# Patient Record
Sex: Female | Born: 1980
Health system: Southern US, Community
[De-identification: ages and names within clinical notes are randomized; demographics above are authoritative.]

## PROBLEM LIST (undated history)

## (undated) DIAGNOSIS — Z9889 Other specified postprocedural states: Secondary | ICD-10-CM

## (undated) DIAGNOSIS — T8859XA Other complications of anesthesia, initial encounter: Secondary | ICD-10-CM

## (undated) DIAGNOSIS — M1990 Unspecified osteoarthritis, unspecified site: Secondary | ICD-10-CM

## (undated) DIAGNOSIS — F419 Anxiety disorder, unspecified: Secondary | ICD-10-CM

## (undated) DIAGNOSIS — T7840XA Allergy, unspecified, initial encounter: Secondary | ICD-10-CM

## (undated) DIAGNOSIS — R112 Nausea with vomiting, unspecified: Secondary | ICD-10-CM

## (undated) DIAGNOSIS — K5090 Crohn's disease, unspecified, without complications: Secondary | ICD-10-CM

## (undated) DIAGNOSIS — K219 Gastro-esophageal reflux disease without esophagitis: Secondary | ICD-10-CM

## (undated) HISTORY — DX: Anxiety disorder, unspecified: F41.9

## (undated) HISTORY — PX: KNEE ARTHROSCOPY: SUR90

## (undated) HISTORY — DX: Gastro-esophageal reflux disease without esophagitis: K21.9

## (undated) HISTORY — DX: Unspecified osteoarthritis, unspecified site: M19.90

## (undated) HISTORY — DX: Allergy, unspecified, initial encounter: T78.40XA

---

## 2000-08-13 HISTORY — PX: LIPOMA EXCISION: SHX5283

## 2006-08-13 HISTORY — PX: TONSILLECTOMY: SUR1361

## 2016-03-16 DIAGNOSIS — N83209 Unspecified ovarian cyst, unspecified side: Secondary | ICD-10-CM | POA: Diagnosis not present

## 2016-03-16 DIAGNOSIS — R1031 Right lower quadrant pain: Secondary | ICD-10-CM | POA: Diagnosis not present

## 2016-04-14 ENCOUNTER — Encounter: Payer: Self-pay | Admitting: *Deleted

## 2016-04-14 ENCOUNTER — Ambulatory Visit
Admission: EM | Admit: 2016-04-14 | Discharge: 2016-04-14 | Disposition: A | Discharge status: Home / Self Care | Payer: 59 | Attending: Family Medicine | Admitting: Family Medicine

## 2016-04-14 DIAGNOSIS — G43909 Migraine, unspecified, not intractable, without status migrainosus: Secondary | ICD-10-CM

## 2016-04-14 HISTORY — DX: Crohn's disease, unspecified, without complications: K50.90

## 2016-04-14 MED ORDER — KETOROLAC TROMETHAMINE 60 MG/2ML IM SOLN
60.0000 mg | Freq: Once | INTRAMUSCULAR | Status: AC
  Administered 2016-04-14: 60 mg via INTRAMUSCULAR

## 2016-04-14 MED ORDER — PROMETHAZINE HCL 25 MG/ML IJ SOLN
25.0000 mg | Freq: Four times a day (QID) | INTRAMUSCULAR | Status: DC | PRN
  Administered 2016-04-14: 25 mg via INTRAMUSCULAR

## 2016-04-14 MED ORDER — ONDANSETRON 8 MG PO TBDP
8.0000 mg | ORAL_TABLET | Freq: Once | ORAL | Status: AC
  Administered 2016-04-14: 8 mg via ORAL

## 2016-04-14 NOTE — ED Provider Notes (Signed)
[facility] ____________________________________________  Time seen: Approximately 9:00 AM  I have reviewed the triage vital signs and the nursing notes.   HISTORY  Chief Complaint Migraine; Nausea; and Emesis  HPI Erin Hull is a 35 y.o. female  presents with mother at bedside for the complaints of migraine headache. Patient reports she has a chronic history of migraine headaches which are normally triggered by stress or hormonal changes. Patient reports onset of headache yesterday evening that came on gradually, and gradually worsened. Patient reports her headache is a left-sided throbbing headache, with accompanying nausea, sound sensitivity and a mild light sensitivity. Patient reports the symptoms are consistent with her normal past migraines. Denies abrupt onset. Denies worst headache of her life. Reports vomiting 1 this morning. Reports has continued drink fluids well.  Denies recent sickness. Denies fall, injury or head trauma. Patient reports that she has taken over-the-counter ibuprofen, but also reports she did take a tramadol last night which did help with the pain. Patient reports last migraine headache was approximately 5 months ago. Patient reports she used to have migraines multiple times per month related to stress, but reports she removed her stressful situation.  Patient reports she started a new oral contraceptive to half weeks ago prescribed by her OB/GYN to help control a right ovarian cyst. Patient states she is unsure the name of the birth control but states that she was told it had higher levels of estrogen than her previous. Patient reports that she has had chronic migraines since age 65.   Denies vision changes, neck pain, back pain, fevers, numbness or tingling sensations, dizziness, paresthesias, extremity weakness, unsteady gait, chest pain, shortness of breath, chest pain with deep breath, dysuria, vaginal complaints.  PCP:  Schermerhorn Patient's last menstrual period was 04/12/2016 (exact date). Denies chance of pregnancy.    Past Medical History:  Diagnosis Date  . Crohn disease (HCC)   Migraines Right Ovarian Cyst  There are no active problems to display for this patient.   Past Surgical History:  Procedure Laterality Date  . KNEE ARTHROSCOPY Left   . TONSILLECTOMY         Current Facility-Administered Medications:  .  promethazine (PHENERGAN) injection 25 mg, 25 mg, Intramuscular, Q6H PRN, Renford Dills, NP, 25 mg at 04/14/16 0160 No current outpatient prescriptions on file.  Allergies Lortab [hydrocodone-acetaminophen] and Percocet [oxycodone-acetaminophen]  History reviewed. No pertinent family history.  Social History Social History  Substance Use Topics  . Smoking status: Never Smoker  . Smokeless tobacco: Never Used  . Alcohol use No    Review of Systems Constitutional: No fever/chills Eyes: No visual changes. ENT: No sore throat. Cardiovascular: Denies chest pain. Respiratory: Denies shortness of breath. Gastrointestinal: No abdominal pain.  No nausea, no vomiting.  No diarrhea.  No constipation. Genitourinary: Negative for dysuria. Musculoskeletal: Negative for back pain. Skin: Negative for rash. Neurological: Negative for , focal weakness or numbness.  10-point ROS otherwise negative.  ____________________________________________   PHYSICAL EXAM:  VITAL SIGNS: ED Triage Vitals  Enc Vitals Group     BP 04/14/16 0834 107/69     Pulse Rate 04/14/16 0834 66     Resp 04/14/16 0834 16     Temp 04/14/16 0834 98.5 F (36.9 C)     Temp Source 04/14/16 0834 Oral     SpO2 04/14/16 0834 100 %     Weight 04/14/16 0836 190 lb (86.2 kg)     Height 04/14/16 0836 5\' 7"  (1.702 m)  Head Circumference --      Peak Flow --      Pain Score 04/14/16 0841 9     Pain Loc --      Pain Edu? --      Excl. in GC? --     Constitutional: Alert and oriented. Well appearing  and in no acute distress. Eyes: Conjunctivae are normal. PERRL. EOMI. No pain with EOMs. Anterior chambers with limited bedside exam normal, no papilledema.  Head: Atraumatic. No tenderness over temporal arteries. No sinus TTP. No tenderness to palpation.   Ears: no erythema, normal TMs bilaterally.   Nose: No congestion/rhinnorhea.  Mouth/Throat: Mucous membranes are moist.  Oropharynx non-erythematous. Neck: No stridor.  No cervical spine tenderness to palpation. No carotid bruits.  Hematological/Lymphatic/Immunilogical: No cervical lymphadenopathy. Cardiovascular: Normal rate, regular rhythm. Grossly normal heart sounds.  Good peripheral circulation. Respiratory: Normal respiratory effort.  No retractions. Lungs CTAB. Gastrointestinal: Soft and nontender. No distention. Normal Bowel sounds.  No abdominal bruits. No CVA tenderness. Musculoskeletal: No lower or upper extremity tenderness nor edema.  No joint effusions. Bilateral pedal pulses equal and easily palpated. No cervical, thoracic or lumbar TTP.  Neurologic:  Normal speech and language. No gross focal neurologic deficits are appreciated. CN II-12 grossly intact. No gait instability. No ataxia, normal finger to nose. Negative Romberg. Negative pronator drift. Normal finger to nose bilaterally. Normal heel to shins bilaterally. No meningismus.   Skin:  Skin is warm, dry and intact. No rash noted. Psychiatric: Mood and affect are normal. Speech and behavior are normal.  ___________________________________________   LABS (all labs ordered are listed, but only abnormal results are displayed)  Labs Reviewed - No data to display  RADIOLOGY  No results found. ____________________________________________   PROCEDURES Procedures    INITIAL IMPRESSION / ASSESSMENT AND PLAN / ED COURSE  Pertinent labs & imaging results that were available during my care of the patient were reviewed by me and considered in my medical decision making  (see chart for details).  Very well-appearing patient. Mother at bedside. Presents for complaints of left-sided throbbing headache described as migraine, which patient reports is consistent with her chronic migraines. Patient expresses concern of oral birth control medication change leading to as a trigger. Counseled regarding risks and concerning etiologies. Discussed evaluation by imaging of head, patient states that she does not want imaging of her head at this time and understands the risk. Will treat patient with 60 mg IM Toradol and 25 mg IM Phenergan at this time and will reevaluate.   Patient reevaluated. Patient reports initial pain 9/10, patient reports no pain is 2 out of 10. Patient reports she is feeling much better and states she has a minimal dull headache at this time. Patient reports she feels very steady on her feet, nausea is fully resolved and states she is again much improved. Patient states that she is feeling well and wants to be discharged. Patient steady gait, negative Romberg, well-appearing patient. No focal neurological deficits. Counseled regarding strict follow-up and return parameters. Encouraged rest, fluids and avoidance of triggers. Patient reports she is going to be stopping her current oral contraceptive and will follow-up with her OB/GYN.  Discussed follow up with Primary care physician this week. Discussed follow up and return parameters including no resolution or any worsening concerns. Patient verbalized understanding and agreed to plan.   ____________________________________________   FINAL CLINICAL IMPRESSION(S) / ED DIAGNOSES  Final diagnoses:  Migraine without status migrainosus, not intractable, unspecified migraine type  There are no discharge medications for this patient.   Note: This dictation was prepared with Dragon dictation along with smaller phrase technology. Any transcriptional errors that result from this process are unintentional.     Clinical Course      Renford Dills, NP 04/14/16 1155

## 2016-04-14 NOTE — ED Triage Notes (Signed)
Onset of migraine last night and awoke this morning with N/V. Hx of migraines. Pt states recent change in birth control med and associates change with this migraine.

## 2016-04-14 NOTE — Discharge Instructions (Signed)
Rest. Drink plenty of fluids. Avoid triggers.  Follow up with your primary care physician this week. Return to Urgent care or ER for new or worsening concerns.

## 2016-04-19 ENCOUNTER — Emergency Department
Admission: EM | Admit: 2016-04-19 | Discharge: 2016-04-19 | Disposition: A | Discharge status: Home / Self Care | Payer: 59 | Attending: Emergency Medicine | Admitting: Emergency Medicine

## 2016-04-19 DIAGNOSIS — R519 Headache, unspecified: Secondary | ICD-10-CM

## 2016-04-19 DIAGNOSIS — R51 Headache: Secondary | ICD-10-CM | POA: Diagnosis not present

## 2016-04-19 LAB — CBC
HCT: 40 % (ref 35.0–47.0)
HEMOGLOBIN: 13.9 g/dL (ref 12.0–16.0)
MCH: 31.8 pg (ref 26.0–34.0)
MCHC: 34.7 g/dL (ref 32.0–36.0)
MCV: 91.9 fL (ref 80.0–100.0)
PLATELETS: 220 10*3/uL (ref 150–440)
RBC: 4.36 MIL/uL (ref 3.80–5.20)
RDW: 12.2 % (ref 11.5–14.5)
WBC: 6.2 10*3/uL (ref 3.6–11.0)

## 2016-04-19 LAB — BASIC METABOLIC PANEL
Anion gap: 4 — ABNORMAL LOW (ref 5–15)
BUN: 14 mg/dL (ref 6–20)
CALCIUM: 8.7 mg/dL — AB (ref 8.9–10.3)
CHLORIDE: 107 mmol/L (ref 101–111)
CO2: 27 mmol/L (ref 22–32)
CREATININE: 0.69 mg/dL (ref 0.44–1.00)
GFR calc non Af Amer: >60 mL/min (ref >60)
Glucose, Bld: 83 mg/dL (ref 65–99)
Potassium: 4.1 mmol/L (ref 3.5–5.1)
SODIUM: 138 mmol/L (ref 135–145)

## 2016-04-19 MED ORDER — KETOROLAC TROMETHAMINE 30 MG/ML IJ SOLN
30.0000 mg | Freq: Once | INTRAMUSCULAR | Status: AC
  Administered 2016-04-19: 30 mg via INTRAVENOUS
  Filled 2016-04-19: qty 1

## 2016-04-19 MED ORDER — DEXAMETHASONE SODIUM PHOSPHATE 10 MG/ML IJ SOLN
10.0000 mg | Freq: Once | INTRAMUSCULAR | Status: AC
  Administered 2016-04-19: 10 mg via INTRAVENOUS
  Filled 2016-04-19: qty 1

## 2016-04-19 MED ORDER — METOCLOPRAMIDE HCL 5 MG/ML IJ SOLN
10.0000 mg | Freq: Once | INTRAMUSCULAR | Status: AC
  Administered 2016-04-19: 10 mg via INTRAVENOUS
  Filled 2016-04-19: qty 2

## 2016-04-19 MED ORDER — DIPHENHYDRAMINE HCL 50 MG/ML IJ SOLN
25.0000 mg | Freq: Once | INTRAMUSCULAR | Status: AC
  Administered 2016-04-19: 25 mg via INTRAVENOUS
  Filled 2016-04-19: qty 1

## 2016-04-19 MED ORDER — SODIUM CHLORIDE 0.9 % IV BOLUS (SEPSIS)
1000.0000 mL | Freq: Once | INTRAVENOUS | Status: AC
  Administered 2016-04-19: 1000 mL via INTRAVENOUS

## 2016-04-19 NOTE — Discharge Instructions (Signed)
You were evaluated for a headache, and treated in the emergency department. Although no certain cause was found, it seems like a migraine similar to your prior episodes.  As we discussed, you do need to follow-up with a neurologist given frequency and recurrent headaches.  Return to the emergency department immediately for any worsening headache, confusion or altered mental status, fever, neck stiffness, numbness or weakness, dizziness or passing out, or any other symptoms concerning to you.

## 2016-04-19 NOTE — ED Triage Notes (Signed)
Pt arrives with reports of 8/10 migraine pain that began last night   Pt reports being seen here last week and her migraine had never really gone away but last night and this am the pain has been much worse  Nausea present emesis x1 this am

## 2016-04-19 NOTE — ED Notes (Signed)
Dr. Shaune Pollack at bedside speaking with patient at this time.

## 2016-04-19 NOTE — ED Provider Notes (Signed)
[facility] Emergency Department Provider Note ____________________________________________   I have reviewed the triage vital signs and the triage nursing note.  HISTORY  Chief Complaint Migraine and Nausea   Historian Patient  HPI Erin Hull Hull is a 35 y.o. female with a history of recurrent ovarian cysts, migraines felt to be related to hormonal changes, and Crohn disease, who is presenting today with headache that started last night and is persistent after sleeping this morning and currently moderate to severe rated 7 out of 10. Global in nature. Denies vision changes. Moderate nausea. No fever. Headache is similar to prior "migraines." She states that she has not seen a neurologist, and [facility]. States that she has had a CT of her head in the past related to these headaches when she was in the [employer].  Denies weakness or numbness, confusion or altered mental status.  She switched birth control pills several weeks ago thinking that the previous birth-control pills may be concerning migraines, and she feels like her headaches have actually become more frequent. She stopped the birth control pill couple days ago.    Past Medical History:  Diagnosis Date  . Crohn disease (HCC)     There are no active problems to display for this patient.   Past Surgical History:  Procedure Laterality Date  . KNEE ARTHROSCOPY Left   . TONSILLECTOMY      Prior to Admission medications   Not on File    Allergies  Allergen Reactions  . Lortab [Hydrocodone-Acetaminophen] Hives  . Percocet [Oxycodone-Acetaminophen] Hives    No family history on file.  Social History Social History  Substance Use Topics  . Smoking status: Never Smoker  . Smokeless tobacco: Never Used  . Alcohol use No    Review of Systems  Constitutional: Negative for fever. Eyes: Negative for visual changes. ENT: Negative for sore  throat. Cardiovascular: Negative for chest pain. Respiratory: Negative for shortness of breath. Gastrointestinal: Negative for abdominal pain, vomiting and diarrhea. Genitourinary: Negative for dysuria. Musculoskeletal: Negative for back pain. Skin: Negative for rash. Neurological: Positive for headache. 10 point Review of Systems otherwise negative ____________________________________________   PHYSICAL EXAM:  VITAL SIGNS: ED Triage Vitals  Enc Vitals Group     BP 04/19/16 0727 123/80     Pulse Rate 04/19/16 0727 76     Resp 04/19/16 0727 16     Temp 04/19/16 0727 98.2 F (36.8 C)     Temp src --      SpO2 04/19/16 0727 100 %     Weight 04/19/16 0728 190 lb (86.2 kg)     Height 04/19/16 0728 5\' 7"  (1.702 m)     Head Circumference --      Peak Flow --      Pain Score 04/19/16 0733 8     Pain Loc --      Pain Edu? --      Excl. in GC? --      Constitutional: Alert and oriented. Well appearing and in no distress. HEENT   Head: Normocephalic and atraumatic.      Eyes: Conjunctivae are normal. PERRL. Normal extraocular movements.      Ears:         Nose: No congestion/rhinnorhea.   Mouth/Throat: Mucous membranes are moist.   Neck: No stridor. Cardiovascular/Chest: Normal rate, regular rhythm.  No murmurs, rubs, or gallops. Respiratory: Normal respiratory effort without tachypnea nor retractions. Breath sounds  are clear and equal bilaterally. No wheezes/rales/rhonchi. Gastrointestinal: Soft. No distention, no guarding, no rebound. Nontender.    Genitourinary/rectal:Deferred Musculoskeletal: Nontender with normal range of motion in all extremities. No joint effusions.  No lower extremity tenderness.  No edema. Neurologic:  Normal speech and language. No gross or focal neurologic deficits are appreciated. Skin:  Skin is warm, dry and intact. No rash noted. Psychiatric: Mood and affect are normal. Speech and behavior are normal. Patient exhibits appropriate insight  and judgment.  ____________________________________________   EKG I, Governor Rooks, MD, the attending physician have personally viewed and interpreted all ECGs.  None ____________________________________________  LABS (pertinent positives/negatives)  Labs Reviewed  BASIC METABOLIC PANEL - Abnormal; Notable for the following:       Result Value   Calcium 8.7 (*)    Anion gap 4 (*)    All other components within normal limits  CBC    ____________________________________________  RADIOLOGY All Xrays were viewed by me. Imaging interpreted by Radiologist.  None __________________________________________  PROCEDURES  Procedure(s) performed: None  Critical Care performed: None  ____________________________________________   ED COURSE / ASSESSMENT AND PLAN  Pertinent labs & imaging results that were available during my care of the patient were reviewed by me and considered in my medical decision making (see chart for details).   [name] is here for headache similar to prior headaches that she calls migraines. She has not seen a neurologist, and I have told her that she definitely needs to follow-up with a neurologist. I will refer her to [facility] clinic neurology, or she can follow up at the [place].  In terms of the headache, she does not have any high risk/red flag features and at this time I'm not recommending any imaging.  I am going to treat her for migraine type headache, and Decadron to potentially help with rebound headache.  We also discussed some alternative/complementarytreatments including trial of anti-inflammatory diet.  11:45 AM, patient feels much better, able for discharge home.  CONSULTATIONS:   None   Patient / Family / Caregiver informed of clinical course, medical decision-making process, and agree with plan.   I discussed return precautions, follow-up instructions, and discharged instructions with patient and/or  family.   ___________________________________________   FINAL CLINICAL IMPRESSION(S) / ED DIAGNOSES   Final diagnoses:  Headache, unspecified headache type              Note: This dictation was prepared with Dragon dictation. Any transcriptional errors that result from this process are unintentional    Governor Rooks, MD 04/19/16 1152

## 2016-04-19 NOTE — ED Notes (Signed)
Pt visualized in NAD at this time. Pt resting in bed with lights dimmed and TV turned down. PT denies any needs at this time. Will continue to monitor for further patient needs.

## 2016-04-19 NOTE — ED Notes (Signed)
Pt visualized in NAD at this time. Pt resting in bed, respirations even and unlabored, VSS and WNL. Call bell remains within reach. Will continue to monitor for further patient needs.

## 2016-04-19 NOTE — ED Notes (Signed)
NAD noted at this time. Pt resting in bed at this time. Pt hooked back up to the monitor after being let up to go to the bathroom. Will continue to monitor for further patient needs.

## 2016-04-19 NOTE — ED Notes (Signed)
NAD noted at time of D/C. Pt denies questions or concerns. Pt ambulatory to the lobby at this time. PT refused wheelchair to the lobby at this time.  

## 2016-04-19 NOTE — ED Notes (Signed)
Lights dimmed and TV turned down for patient comfort. Pt denies any further needs at this time. Will continue to monitor for further patient needs at this time.

## 2016-04-24 DIAGNOSIS — M5481 Occipital neuralgia: Secondary | ICD-10-CM | POA: Diagnosis not present

## 2016-04-24 DIAGNOSIS — E559 Vitamin D deficiency, unspecified: Secondary | ICD-10-CM | POA: Diagnosis not present

## 2016-04-24 DIAGNOSIS — E538 Deficiency of other specified B group vitamins: Secondary | ICD-10-CM | POA: Diagnosis not present

## 2016-04-24 DIAGNOSIS — G43119 Migraine with aura, intractable, without status migrainosus: Secondary | ICD-10-CM | POA: Diagnosis not present

## 2016-04-24 DIAGNOSIS — R51 Headache: Secondary | ICD-10-CM | POA: Diagnosis not present

## 2016-04-26 DIAGNOSIS — R51 Headache: Secondary | ICD-10-CM | POA: Diagnosis not present

## 2016-04-26 DIAGNOSIS — G43909 Migraine, unspecified, not intractable, without status migrainosus: Secondary | ICD-10-CM | POA: Diagnosis not present

## 2016-04-26 DIAGNOSIS — R197 Diarrhea, unspecified: Secondary | ICD-10-CM | POA: Diagnosis not present

## 2016-04-26 DIAGNOSIS — Z3202 Encounter for pregnancy test, result negative: Secondary | ICD-10-CM | POA: Diagnosis not present

## 2016-04-26 DIAGNOSIS — R42 Dizziness and giddiness: Secondary | ICD-10-CM | POA: Diagnosis not present

## 2016-04-27 ENCOUNTER — Other Ambulatory Visit: Payer: Self-pay | Admitting: Neurology

## 2016-04-27 DIAGNOSIS — G43119 Migraine with aura, intractable, without status migrainosus: Secondary | ICD-10-CM

## 2016-05-09 ENCOUNTER — Ambulatory Visit: Admission: RE | Admit: 2016-05-09 | Payer: 59 | Source: Ambulatory Visit

## 2016-05-10 DIAGNOSIS — G43119 Migraine with aura, intractable, without status migrainosus: Secondary | ICD-10-CM | POA: Insufficient documentation

## 2016-05-10 DIAGNOSIS — G43109 Migraine with aura, not intractable, without status migrainosus: Secondary | ICD-10-CM | POA: Insufficient documentation

## 2016-06-05 DIAGNOSIS — O021 Missed abortion: Secondary | ICD-10-CM | POA: Diagnosis not present

## 2016-06-05 NOTE — H&P (Signed)
Erin Hull is a 35 y.o. female here for Pre Op Consulting (possible D&C missed ab) .  HPI:  Pt presents for a preoperative visit to schedule a D&C for SAB.  She has a hx of:  Crohn's disease Migraines   Workup has included: TVUS with gestational sac fetal pole without cardiac activity at OSH. LMP 04/11/16, placing her at [redacted]w[redacted]d today by LMP, but ultrasound above showing fetal pole was at [redacted]w[redacted]d.  She is experiencing daily cramping but vaginal bleeding x1.  Past Medical History:  has a past medical history of Arthritis; Crohn's disease (CMS-HCC); Crohn's disease (CMS-HCC); Depression, unspecified; GERD (gastroesophageal reflux disease); and Migraine headache.  Past Surgical History:  has a past surgical history that includes left knee arthoscopy and tonsilectomy. Family History: family history includes Aneurysm in her paternal grandmother; Brain hemorrhage in her paternal grandmother; COPD in her father; Coronary artery disease in her father; Diabetes mellitus in her brother and father; Gout in her mother; Heart attack in her father and maternal grandmother; Heart disease in her father; Hypertension in her father; Stroke in her father; Tremor in her father. Social History:  reports that she has never smoked. She has never used smokeless tobacco. She reports that she does not drink alcohol or use drugs. OB/GYN History:          OB History    Gravida Para Term Preterm AB Living   4 3 2 1 1 3    SAB TAB Ectopic Molar Multiple Live Births   1                Allergies: is allergic to lortab [hydrocodone-acetaminophen] and percocet [oxycodone-acetaminophen]. Medications:  Current Outpatient Prescriptions:  .  norethindrone-ethinyl estradiol (ORTHO-NOVUM, NORTREL) 1-35 mg-mcg tablet, Take 1 tablet by mouth once daily. (Patient not taking: Reported on 06/05/2016 ), Disp: 1 Package, Rfl: 11 .  topiramate (TOPAMAX) 25 MG tablet, Take 1 tablet (25 mg total) by mouth 2 (two) times daily.  Take only 25mg  at night for the first two weeks. (Patient not taking: Reported on 06/05/2016 ), Disp: 60 tablet, Rfl: 3 .  traMADol (ULTRAM) 50 mg tablet, Take 50 mg by mouth every 6 (six) hours as needed for Pain., Disp: , Rfl:   Review of Systems: No SOB, no palpitations or chest pain, no new lower extremity edema, no nausea or vomiting or bowel or bladder complaints. See HPI for gyn specific ROS.   Exam:      Vitals:   06/05/16 0932  BP: (P) 123/84  Pulse: (P) 85    WDWN white female in NAD Body mass index is 31.7 kg/m (pended).  General: Patient is well-groomed, well-nourished, appears stated age in no acute distress  HEENT: head is atraumatic and normocephalic, trachea is midline, neck is supple with no palpable nodules  CV: Regular rhythm and normal heart rate, no murmur  Pulm: Clear to auscultation throughout lung fields with no wheezing, crackles, or rhonchi. No increased work of breathing  Abdomen: soft , no mass, non-tender, no rebound tenderness, no hepatomegaly  Pelvic: deferred  Impression:   The encounter diagnosis was Missed abortion.    Plan:   -  Preoperative visit: D&C for missed AB. Consents signed today. Risks of surgery were discussed with the patient including but not limited to: bleeding which may require transfusion; infection which may require antibiotics; injury to uterus or surrounding organs; intrauterine scarring which may impair future fertility; need for additional procedures including laparotomy or laparoscopy; and other postoperative/anesthesia complications.  Written informed consent was obtained.  This is a scheduled same-day surgery. She will have a postop visit in 2 weeks to review operative findings and pathology.  This is an unwanted pregnancy, and she was tearful when I told her that the cardiac activity was absent. I offered her a followup ultrasound to confirm prior to moving forward with treatment. She  declines.  If she is Rh negative, she will need Rhogam, which will be given at the time of the procedure.  I will plan for cervical block because of her reluctance to take medication, and because of her allergies to narcotics in the past. -   Return in about 2 weeks (around 06/19/2016) for Postop check.  Erin KicksBETHANY EVANGELINE Annika Selke, MD

## 2016-06-07 ENCOUNTER — Ambulatory Visit
Admission: RE | Admit: 2016-06-07 | Discharge: 2016-06-07 | Disposition: A | Discharge status: Home / Self Care | Payer: 59 | Source: Ambulatory Visit | Attending: Obstetrics and Gynecology | Admitting: Obstetrics and Gynecology

## 2016-06-07 ENCOUNTER — Encounter
Admission: RE | Disposition: A | Discharge status: Home / Self Care | Payer: Self-pay | Source: Ambulatory Visit | Attending: Obstetrics and Gynecology

## 2016-06-07 ENCOUNTER — Encounter: Payer: Self-pay | Admitting: *Deleted

## 2016-06-07 ENCOUNTER — Ambulatory Visit: Payer: 59 | Admitting: Anesthesiology

## 2016-06-07 ENCOUNTER — Encounter
Admission: RE | Admit: 2016-06-07 | Discharge: 2016-06-07 | Disposition: A | Discharge status: Home / Self Care | Payer: 59 | Source: Ambulatory Visit | Attending: Obstetrics and Gynecology | Admitting: Obstetrics and Gynecology

## 2016-06-07 DIAGNOSIS — K5090 Crohn's disease, unspecified, without complications: Secondary | ICD-10-CM | POA: Insufficient documentation

## 2016-06-07 DIAGNOSIS — F329 Major depressive disorder, single episode, unspecified: Secondary | ICD-10-CM | POA: Insufficient documentation

## 2016-06-07 DIAGNOSIS — O021 Missed abortion: Secondary | ICD-10-CM | POA: Diagnosis not present

## 2016-06-07 DIAGNOSIS — Z885 Allergy status to narcotic agent status: Secondary | ICD-10-CM | POA: Insufficient documentation

## 2016-06-07 DIAGNOSIS — N939 Abnormal uterine and vaginal bleeding, unspecified: Secondary | ICD-10-CM | POA: Diagnosis present

## 2016-06-07 DIAGNOSIS — K219 Gastro-esophageal reflux disease without esophagitis: Secondary | ICD-10-CM | POA: Insufficient documentation

## 2016-06-07 HISTORY — PX: DILATION AND EVACUATION: SHX1459

## 2016-06-07 LAB — CBC
HEMATOCRIT: 39.9 % (ref 35.0–47.0)
Hemoglobin: 13.7 g/dL (ref 12.0–16.0)
MCH: 31.6 pg (ref 26.0–34.0)
MCHC: 34.3 g/dL (ref 32.0–36.0)
MCV: 92 fL (ref 80.0–100.0)
Platelets: 202 10*3/uL (ref 150–440)
RBC: 4.34 MIL/uL (ref 3.80–5.20)
RDW: 12.9 % (ref 11.5–14.5)
WBC: 8.2 10*3/uL (ref 3.6–11.0)

## 2016-06-07 LAB — TYPE AND SCREEN
ABO/RH(D): A POS
Antibody Screen: NEGATIVE

## 2016-06-07 LAB — BASIC METABOLIC PANEL
Anion gap: 7 (ref 5–15)
BUN: 9 mg/dL (ref 6–20)
CALCIUM: 8.6 mg/dL — AB (ref 8.9–10.3)
CO2: 24 mmol/L (ref 22–32)
CREATININE: 0.6 mg/dL (ref 0.44–1.00)
Chloride: 105 mmol/L (ref 101–111)
GFR calc non Af Amer: >60 mL/min (ref >60)
GLUCOSE: 85 mg/dL (ref 65–99)
Potassium: 3.6 mmol/L (ref 3.5–5.1)
Sodium: 136 mmol/L (ref 135–145)

## 2016-06-07 SURGERY — DILATION AND EVACUATION, UTERUS
Anesthesia: General | Wound class: Clean Contaminated

## 2016-06-07 MED ORDER — DOCUSATE SODIUM 100 MG PO CAPS: 100.0000 mg | ORAL_CAPSULE | Freq: Every day | ORAL | 3 refills | Status: DC | PRN

## 2016-06-07 MED ORDER — KETOROLAC TROMETHAMINE 30 MG/ML IJ SOLN
INTRAMUSCULAR | Status: DC | PRN
  Administered 2016-06-07: 30 mg via INTRAVENOUS

## 2016-06-07 MED ORDER — ACETAMINOPHEN 10 MG/ML IV SOLN
INTRAVENOUS | Status: DC | PRN
  Administered 2016-06-07: 1000 mg via INTRAVENOUS

## 2016-06-07 MED ORDER — MIDAZOLAM HCL 2 MG/2ML IJ SOLN
INTRAMUSCULAR | Status: DC | PRN
  Administered 2016-06-07: 2 mg via INTRAVENOUS

## 2016-06-07 MED ORDER — RHO D IMMUNE GLOBULIN 1500 UNIT/2ML IJ SOSY
300.0000 ug | PREFILLED_SYRINGE | Freq: Once | INTRAMUSCULAR | Status: DC
  Filled 2016-06-07: qty 2

## 2016-06-07 MED ORDER — ACETAMINOPHEN 10 MG/ML IV SOLN
INTRAVENOUS | Status: AC
  Filled 2016-06-07: qty 100

## 2016-06-07 MED ORDER — ONDANSETRON HCL 4 MG/2ML IJ SOLN
INTRAMUSCULAR | Status: DC | PRN
  Administered 2016-06-07: 4 mg via INTRAVENOUS

## 2016-06-07 MED ORDER — DOXYCYCLINE HYCLATE 100 MG PO TABS
200.0000 mg | ORAL_TABLET | Freq: Once | ORAL | Status: AC
  Administered 2016-06-07: 200 mg via ORAL
  Filled 2016-06-07: qty 2

## 2016-06-07 MED ORDER — DEXAMETHASONE SODIUM PHOSPHATE 10 MG/ML IJ SOLN
INTRAMUSCULAR | Status: DC | PRN
  Administered 2016-06-07: 10 mg via INTRAVENOUS

## 2016-06-07 MED ORDER — ONDANSETRON 4 MG PO TBDP: 4.0000 mg | ORAL_TABLET | Freq: Three times a day (TID) | ORAL | 0 refills | Status: DC | PRN

## 2016-06-07 MED ORDER — FAMOTIDINE 20 MG PO TABS
20.0000 mg | ORAL_TABLET | Freq: Once | ORAL | Status: AC
  Administered 2016-06-07: 20 mg via ORAL

## 2016-06-07 MED ORDER — PROMETHAZINE HCL 25 MG/ML IJ SOLN: 12.5000 mg | Freq: Once | INTRAMUSCULAR | Status: DC | PRN

## 2016-06-07 MED ORDER — PROPOFOL 500 MG/50ML IV EMUL
INTRAVENOUS | Status: DC | PRN
  Administered 2016-06-07: 120 ug/kg/min via INTRAVENOUS

## 2016-06-07 MED ORDER — FAMOTIDINE 20 MG PO TABS
ORAL_TABLET | ORAL | Status: AC
  Administered 2016-06-07: 20 mg via ORAL
  Filled 2016-06-07: qty 1

## 2016-06-07 MED ORDER — IBUPROFEN 800 MG PO TABS: 800.0000 mg | ORAL_TABLET | Freq: Three times a day (TID) | ORAL | 0 refills | Status: AC | PRN

## 2016-06-07 MED ORDER — PROPOFOL 10 MG/ML IV BOLUS
INTRAVENOUS | Status: DC | PRN
  Administered 2016-06-07: 200 mg via INTRAVENOUS

## 2016-06-07 MED ORDER — SILVER NITRATE-POT NITRATE 75-25 % EX MISC
CUTANEOUS | Status: DC | PRN
  Administered 2016-06-07: 2

## 2016-06-07 MED ORDER — HYDROMORPHONE HCL 1 MG/ML IJ SOLN: 0.2500 mg | INTRAMUSCULAR | Status: DC | PRN

## 2016-06-07 MED ORDER — LIDOCAINE-EPINEPHRINE 1 %-1:100000 IJ SOLN
INTRAMUSCULAR | Status: AC
  Filled 2016-06-07: qty 1

## 2016-06-07 MED ORDER — FENTANYL CITRATE (PF) 100 MCG/2ML IJ SOLN: 25.0000 ug | INTRAMUSCULAR | Status: DC | PRN

## 2016-06-07 MED ORDER — LIDOCAINE-EPINEPHRINE 1 %-1:100000 IJ SOLN
INTRAMUSCULAR | Status: DC | PRN
  Administered 2016-06-07: 20 mL

## 2016-06-07 MED ORDER — LACTATED RINGERS IV SOLN
INTRAVENOUS | Status: DC
  Administered 2016-06-07: 13:00:00 via INTRAVENOUS

## 2016-06-07 MED ORDER — LIDOCAINE HCL (CARDIAC) 20 MG/ML IV SOLN
INTRAVENOUS | Status: DC | PRN
  Administered 2016-06-07: 30 mg via INTRAVENOUS

## 2016-06-07 SURGICAL SUPPLY — 20 items
CATH ROBINSON RED A/P 16FR (CATHETERS) ×2 IMPLANT
FILTER UTR ASPR SPEC (MISCELLANEOUS) ×1 IMPLANT
FLTR UTR ASPR SPEC (MISCELLANEOUS) ×2
GLOVE BIO SURGEON STRL SZ 6.5 (GLOVE) ×4 IMPLANT
GLOVE INDICATOR 7.0 STRL GRN (GLOVE) ×4 IMPLANT
GOWN STRL REUS W/ TWL LRG LVL3 (GOWN DISPOSABLE) ×2 IMPLANT
GOWN STRL REUS W/TWL LRG LVL3 (GOWN DISPOSABLE) ×2
KIT BERKELEY 1ST TRIMESTER 3/8 (MISCELLANEOUS) ×2 IMPLANT
KIT RM TURNOVER CYSTO AR (KITS) ×2 IMPLANT
PACK DNC HYST (MISCELLANEOUS) ×2 IMPLANT
PAD OB MATERNITY 4.3X12.25 (PERSONAL CARE ITEMS) ×2 IMPLANT
PAD PREP 24X41 OB/GYN DISP (PERSONAL CARE ITEMS) ×2 IMPLANT
SET BERKELEY SUCTION TUBING (SUCTIONS) ×2 IMPLANT
TOWEL OR 17X26 4PK STRL BLUE (TOWEL DISPOSABLE) IMPLANT
VACURETTE 10 RIGID CVD (CANNULA) IMPLANT
VACURETTE 6 ASPIR F TIP BERK (CANNULA) IMPLANT
VACURETTE 7MM F TIP (CANNULA) ×1
VACURETTE 7MM F TIP STRL (CANNULA) ×1 IMPLANT
VACURETTE 8 RIGID CVD (CANNULA) IMPLANT
VACURETTE 8MM F TIP (MISCELLANEOUS) IMPLANT

## 2016-06-07 NOTE — Discharge Instructions (Signed)

## 2016-06-07 NOTE — Interval H&P Note (Signed)
History and Physical Interval Note:  06/07/2016 1:51 PM  Erin Hull  has presented today for surgery, with the diagnosis of Missed Ab  The various methods of treatment have been discussed with the patient and family. After consideration of risks, benefits and other options for treatment, the patient has consented to  Procedure(s): DILATATION AND EVACUATION (N/A) as a surgical intervention .  The patient's history has been reviewed, patient examined, no change in status, stable for surgery.  I have reviewed the patient's chart and labs.  Questions were answered to the patient's satisfaction.     Christeen Douglas

## 2016-06-07 NOTE — Anesthesia Procedure Notes (Signed)
Procedure Name: LMA Insertion Date/Time: 06/07/2016 2:17 PM Performed by: Omer JackWEATHERLY, Sona Nations Pre-anesthesia Checklist: Patient identified, Patient being monitored, Timeout performed, Emergency Drugs available and Suction available Patient Re-evaluated:Patient Re-evaluated prior to inductionOxygen Delivery Method: Circle system utilized Preoxygenation: Pre-oxygenation with 100% oxygen Intubation Type: IV induction Ventilation: Mask ventilation without difficulty LMA: LMA inserted LMA Size: 3.5 Tube type: Oral Number of attempts: 1 Placement Confirmation: positive ETCO2 and breath sounds checked- equal and bilateral Tube secured with: Tape Dental Injury: Teeth and Oropharynx as per pre-operative assessment

## 2016-06-07 NOTE — Transfer of Care (Signed)
Immediate Anesthesia Transfer of Care Note  Patient: Erin Hull  Procedure(s) Performed: Procedure(s): DILATATION AND EVACUATION (N/A)  Patient Location: PACU  Anesthesia Type:General  Level of Consciousness: patient cooperative and lethargic  Airway & Oxygen Therapy: Patient Spontanous Breathing and Patient connected to face mask oxygen  Post-op Assessment: Report given to RN and Post -op Vital signs reviewed and stable  Post vital signs: Reviewed and stable  Last Vitals:  Vitals:   06/07/16 1233 06/07/16 1457  BP: 105/63 (!) 94/48  Pulse: 84 89  Resp: 18 (!) 23  Temp:  36.1 C    Last Pain: There were no vitals filed for this visit.       Complications: No apparent anesthesia complications

## 2016-06-07 NOTE — Op Note (Signed)
Operative Report Suction Dilation and Curettage   Indications: Vaginal bleeding    Pre-operative Diagnosis: Missed abortion at 7 weeks  Post-operative Diagnosis: same.  Procedure: 1. Suction D&C  Surgeon: Christeen Douglas, MD  Assistant(s):  None  Anesthesia: General LMA anesthesia and Local anesthesia 1% buffered lidocaine, paracervical block   Anesthesiologist: Rosaria Ferries, MD Anesthesiologist: Rosaria Ferries, MD CRNA: Omer Jack, CRNA  Estimated Blood Loss:  less than 50 mL         Intraoperative medications: toradol, iv acetominophen         Total IV Fluids:  Urine Output: 7ml         Specimens: products of conception         Complications:  None; patient tolerated the procedure well.         Disposition: PACU - hemodynamically stable.         Condition: stable  Findings: Uterus measuring 7 weeks; normal cervix, vagina, perineum.   Indication for procedure/Consents: 35 y.o. [id number] at 7wks with SAB,  here for scheduled surgery for the aforementioned diagnoses.   Risks of surgery were discussed with the patient including but not limited to: bleeding which may require transfusion; infection which may require antibiotics; injury to uterus or surrounding organs; intrauterine scarring which may impair future fertility; need for additional procedures including laparotomy or laparoscopy; and other postoperative/anesthesia complications. Written informed consent was obtained.    Procedure Details:   The patient was then taken to the operating room where general anesthesia was administered and was found to be adequate.  After a formal and adequate timeout was performed, she was placed in the dorsal lithotomy position and examined with the above findings. She was then prepped and draped in the sterile manner.   Her bladder was catheterized for an estimated amount of clear, yellow urine. A speculum was then placed in the patient's vagina and a single  tooth tenaculum was applied to the anterior lip of the cervix.    No uterine sounding was performed on this pregnant uterus. Her cervix was serially dilated to accommodate a 7 sized flexible suction curette.  A sharp curettage was then performed until there was a gritty texture in all four quadrants.  The tenaculum was removed from the anterior lip of the cervix and the vaginal speculum was removed after noting good hemostasis. The patient tolerated the procedure well and was taken to the recovery area awake, extubated and in stable condition.  The patient will be discharged to home as per PACU criteria.  She will receive a dose of oral antibiotics prior to discharge. Routine postoperative instructions given.  She was prescribed Percocet, Ibuprofen and Colace.  She will follow up in the clinic in two weeks for postoperative evaluation.

## 2016-06-07 NOTE — Anesthesia Preprocedure Evaluation (Addendum)
Anesthesia Evaluation  Patient identified by MRN, date of birth, ID band Patient awake    Reviewed: Allergy & Precautions, H&P , NPO status , Patient's Chart, lab work & pertinent test results  History of Anesthesia Complications (+) PONV and history of anesthetic complications  Airway Mallampati: II  TM Distance: >3 FB Neck ROM: full    Dental  (+) Teeth Intact   Pulmonary neg pulmonary ROS, neg shortness of breath,    Pulmonary exam normal breath sounds clear to auscultation       Cardiovascular Exercise Tolerance: Good (-) Past MI negative cardio ROS Normal cardiovascular exam Rhythm:regular Rate:Normal     Neuro/Psych negative neurological ROS  negative psych ROS   GI/Hepatic negative GI ROS, Neg liver ROS,   Endo/Other  negative endocrine ROS  Renal/GU      Musculoskeletal   Abdominal   Peds  Hematology negative hematology ROS (+)   Anesthesia Other Findings Past Medical History: No date: Crohn disease (HCC)  Past Surgical History: No date: KNEE ARTHROSCOPY Left     Comment: x2 No date: LIPOMA EXCISION     Comment: back No date: TONSILLECTOMY  BMI    Body Mass Index:  31.32 kg/m      Reproductive/Obstetrics (+) Pregnancy 6 week missed abortion                             Anesthesia Physical Anesthesia Plan  ASA: III  Anesthesia Plan: General LMA   Post-op Pain Management:    Induction:   Airway Management Planned:   Additional Equipment:   Intra-op Plan:   Post-operative Plan:   Informed Consent: I have reviewed the patients History and Physical, chart, labs and discussed the procedure including the risks, benefits and alternatives for the proposed anesthesia with the patient or authorized representative who has indicated his/her understanding and acceptance.   Dental Advisory Given  Plan Discussed with: Anesthesiologist, CRNA and Surgeon  Anesthesia  Plan Comments: (Plan for narcotic avoidance as patient reports nausea with PO narcotics.  Plan to have narcotics PRN available in the PACU.)        Anesthesia Quick Evaluation

## 2016-06-08 ENCOUNTER — Encounter: Payer: Self-pay | Admitting: Obstetrics and Gynecology

## 2016-06-11 LAB — SURGICAL PATHOLOGY

## 2016-06-13 NOTE — Anesthesia Postprocedure Evaluation (Signed)
Anesthesia Post Note  Patient: Erin Hull  Procedure(s) Performed: Procedure(s) (LRB): DILATATION AND EVACUATION (N/A)  Patient location during evaluation: PACU Anesthesia Type: General Level of consciousness: awake and alert Pain management: pain level controlled Vital Signs Assessment: post-procedure vital signs reviewed and stable Respiratory status: spontaneous breathing, nonlabored ventilation, respiratory function stable and patient connected to nasal cannula oxygen Cardiovascular status: blood pressure returned to baseline and stable Postop Assessment: no signs of nausea or vomiting Anesthetic complications: no    Last Vitals:  Vitals:   06/07/16 1600 06/07/16 1705  BP: 116/75 118/76  Pulse: 88 86  Resp: 18 18  Temp: 36.7 C     Last Pain:  Vitals:   06/07/16 1705  PainSc: 1                  Cleda MccreedyJoseph K Lissandro Dilorenzo

## 2016-11-19 DIAGNOSIS — M7731 Calcaneal spur, right foot: Secondary | ICD-10-CM | POA: Diagnosis not present

## 2016-11-19 DIAGNOSIS — M7732 Calcaneal spur, left foot: Secondary | ICD-10-CM | POA: Diagnosis not present

## 2016-11-19 DIAGNOSIS — Z3202 Encounter for pregnancy test, result negative: Secondary | ICD-10-CM | POA: Diagnosis not present

## 2016-11-19 DIAGNOSIS — M722 Plantar fascial fibromatosis: Secondary | ICD-10-CM | POA: Diagnosis not present

## 2016-12-20 ENCOUNTER — Ambulatory Visit
Admission: EM | Admit: 2016-12-20 | Discharge: 2016-12-20 | Disposition: A | Discharge status: Home / Self Care | Payer: 59 | Attending: Emergency Medicine | Admitting: Emergency Medicine

## 2016-12-20 DIAGNOSIS — J0140 Acute pansinusitis, unspecified: Secondary | ICD-10-CM | POA: Diagnosis not present

## 2016-12-20 DIAGNOSIS — J301 Allergic rhinitis due to pollen: Secondary | ICD-10-CM | POA: Diagnosis not present

## 2016-12-20 MED ORDER — FLUTICASONE PROPIONATE 50 MCG/ACT NA SUSP: 2.0000 | Freq: Every day | NASAL | 0 refills | Status: DC

## 2016-12-20 MED ORDER — IBUPROFEN 600 MG PO TABS: 600.0000 mg | ORAL_TABLET | Freq: Four times a day (QID) | ORAL | 0 refills | Status: DC | PRN

## 2016-12-20 MED ORDER — AMOXICILLIN-POT CLAVULANATE 875-125 MG PO TABS: 1.0000 | ORAL_TABLET | Freq: Two times a day (BID) | ORAL | 0 refills | Status: DC

## 2016-12-20 NOTE — ED Provider Notes (Signed)
HPI  SUBJECTIVE:  Erin Hull is a 36 y.o. female who presents with worsening frontal/sinus headache, sinus pain and pressure, purulent nasal drainage, states that her upper teeth hurt starting this morning. She reports postnasal drip and a cough productive of the same material as her nasal congestion. She has been having trouble with her allergies with nasal congestion and sinus pressure for the past several weeks and has been taking Xyzal and doing saline spray with some improvement in her symptoms. She has also tried ibuprofen. Last dose was within 6-8 hours of evaluation. Symptoms are worse with bending forward. She denies fevers, facial swelling, wheezing, chest pain, shortness of breath. She does not use nasal steroids nor does she do saline nasal irrigation. No antibiotic in the past month. No ear pain or fullness. She has past medical history seasonal allergies, sinusitis and states this feels similar to previous episodes of that. Also has history of Crohn's, migraines. No history of diabetes, hypertension, lung disease. LMP: 4/14. Denies possibility of being pregnant, states he do not need to check. PMD: None. She is establishing with a primary care physician at the [place] on Saturday.   Past Medical History:  Diagnosis Date  . Crohn disease [facility])     Past Surgical History:  Procedure Laterality Date  . DILATION AND EVACUATION N/A 06/07/2016   Procedure: DILATATION AND EVACUATION;  Surgeon: Christeen Douglas, MD;  Location: [facility] ORS;  Service: Gynecology;  Laterality: N/A;  . KNEE ARTHROSCOPY Left    x2  . LIPOMA EXCISION     back  . TONSILLECTOMY      Family History  Problem Relation Age of Onset  . Healthy Mother     Social History  Substance Use Topics  . Smoking status: Never Smoker  . Smokeless tobacco: Never Used  . Alcohol use No    No current facility-administered medications for this encounter.   Current Outpatient Prescriptions:  .  levocetirizine (XYZAL) 5 MG  tablet, Take 5 mg by mouth every evening., Disp: , Rfl:  .  amoxicillin-clavulanate (AUGMENTIN) 875-125 MG tablet, Take 1 tablet by mouth 2 (two) times daily. X 7 days, Disp: 14 tablet, Rfl: 0 .  fluticasone (FLONASE) 50 MCG/ACT nasal spray, Place 2 sprays into both nostrils daily., Disp: 16 g, Rfl: 0 .  ibuprofen (ADVIL,MOTRIN) 600 MG tablet, Take 1 tablet (600 mg total) by mouth every 6 (six) hours as needed., Disp: 30 tablet, Rfl: 0  Allergies  Allergen Reactions  . Lortab [Hydrocodone-Acetaminophen] Hives  . Percocet [Oxycodone-Acetaminophen] Hives     ROS  As noted in HPI.   Physical Exam  BP 117/65 (BP Location: Left Arm)   Pulse 84   Temp 98.5 F (36.9 C) (Oral)   Resp 18   Ht 5\' 7"  (1.702 m)   Wt 200 lb (90.7 kg)   LMP 11/24/2016   SpO2 100%   Breastfeeding? Unknown   BMI 31.32 kg/m   Constitutional: Well developed, well nourished, no acute distress Eyes:  EOMI, conjunctiva normal bilaterally HENT: Normocephalic, atraumatic,mucus membranes moist. TMs normal bilaterally. + purulent nasal congestion. Swollen red turbinates. +maxillary sinus tenderness, +  frontal sinus tenderness. Oropharynx normal - postnasal drip. Positive cobblestoning. Respiratory: Normal inspiratory effort. Lungs clear bilaterally Cardiovascular: Normal rate regular rhythm, no murmurs rubs gallops GI: nondistended skin: No rash, skin intact Musculoskeletal: no deformities Neurologic: Alert & oriented x 3, no focal neuro deficits Psychiatric: Speech and behavior appropriate   ED Course   Medications - No data  to display  No orders of the defined types were placed in this encounter.   No results found for this or any previous visit (from the past 24 hour(s)). No results found.  ED Clinical Impression  Acute non-recurrent pansinusitis  Seasonal allergic rhinitis due to pollen   ED Assessment/Plan  Patient with a sinus infection, most likely from her allergies. Pt with indications  for abx given duration of symptoms. Will start augmentin, in addition to nasal steriods, size, she is to add Sudafed, saline nasal irrigation, increase fluids, tylenol/motrin prn pain. Discussed MDM and plan with pt.  Pt agrees with plan and will f/u with PMD as scheduled on Saturday.   *This clinic note was created using Dragon dictation software. Therefore, there may be occasional mistakes despite careful proofreading.  ?    Domenick Gong, MD 12/20/16 1358

## 2016-12-20 NOTE — Discharge Instructions (Signed)
You may take 600 mg of motrin with 1 gram of tylenol up to 3-4 times a day as needed for pain. This is an effective combination for pain.  Continue the Xyzal, try adding some Sudafed to it. Drink extra fluids refused to start taking the Sudafed. Use a neti pot or the NeilMed sinus rinse as often as you want to to reduce nasal congestion. Follow the directions on the box.   Go to www.goodrx.com to look up your medications. This will give you a list of where you can find your prescriptions at the most affordable prices.

## 2016-12-20 NOTE — ED Triage Notes (Addendum)
Pt c/o sinus congestion, pressure in her face, she has had some allergies the last couple of weeks and now it has settled in her sinuses. Cough, runny nose and headaches.

## 2016-12-22 DIAGNOSIS — Z0000 Encounter for general adult medical examination without abnormal findings: Secondary | ICD-10-CM | POA: Diagnosis not present

## 2017-02-20 ENCOUNTER — Ambulatory Visit: Payer: Self-pay | Admitting: Physician Assistant

## 2017-04-25 ENCOUNTER — Other Ambulatory Visit: Payer: 59

## 2017-04-25 DIAGNOSIS — R300 Dysuria: Secondary | ICD-10-CM

## 2017-04-25 LAB — URINALYSIS, COMPLETE
BILIRUBIN UA: NEGATIVE
GLUCOSE, UA: NEGATIVE
KETONES UA: NEGATIVE
Leukocytes, UA: NEGATIVE
Nitrite, UA: NEGATIVE
Protein, UA: NEGATIVE
Specific Gravity, UA: 1.025 (ref 1.005–1.030)
UUROB: 0.2 mg/dL (ref 0.2–1.0)
pH, UA: 6 (ref 5.0–7.5)

## 2017-04-29 LAB — CULTURE, URINE COMPREHENSIVE

## 2017-06-06 ENCOUNTER — Ambulatory Visit
Admission: EM | Admit: 2017-06-06 | Discharge: 2017-06-06 | Disposition: A | Discharge status: Home / Self Care | Payer: 59 | Attending: Family Medicine | Admitting: Family Medicine

## 2017-06-06 ENCOUNTER — Encounter: Payer: Self-pay | Admitting: Emergency Medicine

## 2017-06-06 DIAGNOSIS — R197 Diarrhea, unspecified: Secondary | ICD-10-CM | POA: Diagnosis not present

## 2017-06-06 DIAGNOSIS — R1030 Lower abdominal pain, unspecified: Secondary | ICD-10-CM

## 2017-06-06 LAB — GASTROINTESTINAL PANEL BY PCR, STOOL (REPLACES STOOL CULTURE)
Adenovirus F40/41: NOT DETECTED
Astrovirus: NOT DETECTED
CYCLOSPORA CAYETANENSIS: NOT DETECTED
Campylobacter species: NOT DETECTED
Cryptosporidium: NOT DETECTED
ENTAMOEBA HISTOLYTICA: NOT DETECTED
ENTEROTOXIGENIC E COLI (ETEC): NOT DETECTED
Enteroaggregative E coli (EAEC): NOT DETECTED
Enteropathogenic E coli (EPEC): NOT DETECTED
Giardia lamblia: NOT DETECTED
NOROVIRUS GI/GII: NOT DETECTED
PLESIMONAS SHIGELLOIDES: NOT DETECTED
Rotavirus A: NOT DETECTED
SALMONELLA SPECIES: NOT DETECTED
SAPOVIRUS (I, II, IV, AND V): NOT DETECTED
SHIGA LIKE TOXIN PRODUCING E COLI (STEC): NOT DETECTED
SHIGELLA/ENTEROINVASIVE E COLI (EIEC): NOT DETECTED
VIBRIO CHOLERAE: NOT DETECTED
VIBRIO SPECIES: NOT DETECTED
Yersinia enterocolitica: NOT DETECTED

## 2017-06-06 LAB — C DIFFICILE QUICK SCREEN W PCR REFLEX
C DIFFICILE (CDIFF) TOXIN: NEGATIVE
C Diff antigen: NEGATIVE
C Diff interpretation: NOT DETECTED

## 2017-06-06 NOTE — Discharge Instructions (Signed)
Return with the sample.  Take care  Dr. Adriana Simas

## 2017-06-06 NOTE — ED Triage Notes (Signed)
Patient c/o diarrhea that started a few weeks ago.

## 2017-06-06 NOTE — ED Provider Notes (Signed)
[facility]    CSN: [id number] Arrival date & time: 06/06/17  1020  History   Chief Complaint Chief Complaint  Patient presents with  . Diarrhea   HPI  36 year old female with a history of Crohn's disease presents with diarrhea.  Patient is not currently on any medication for her Crohn's.  She states that for the past 3 weeks she has had increased amount of diarrhea.  She reports some associated lower abdominal pain.  She also reports some burning in her upper abdomen.  She works in the [employer] and has had antibiotic exposure in May.  She states that her diarrhea is very foul-smelling.  She is concerned about C. difficile as this is a similar presentation to the time where she had it previously.  No hematochezia or melena.  No known relieving factors.  She is under a great deal of stress that she is going through a separation/divorce.  This may be exacerbating but she is unsure.  Primary concern is C. difficile colitis.  Past Medical History:  Diagnosis Date  . Crohn disease [facility]HCC)    Past Surgical History:  Procedure Laterality Date  . DILATION AND EVACUATION N/A 06/07/2016   Procedure: DILATATION AND EVACUATION;  Surgeon: Christeen DouglasBethany Beasley, MD;  Location: [facility] ORS;  Service: Gynecology;  Laterality: N/A;  . KNEE ARTHROSCOPY Left    x2  . LIPOMA EXCISION     back  . TONSILLECTOMY     OB History    Gravida Para Term Preterm AB Living   1             SAB TAB Ectopic Multiple Live Births                 Home Medications    Prior to Admission medications   Medication Sig Start Date End Date Taking? Authorizing Provider  ibuprofen (ADVIL,MOTRIN) 600 MG tablet Take 1 tablet (600 mg total) by mouth every 6 (six) hours as needed. 12/20/16   Domenick GongMortenson, Ashley, MD   Family History Family History  Problem Relation Age of Onset  . Healthy Mother    Social History Social History  Substance Use Topics  . Smoking status: Never Smoker  . Smokeless tobacco: Never Used  .  Alcohol use No   Allergies   Lortab [hydrocodone-acetaminophen] and Percocet [oxycodone-acetaminophen]   Review of Systems Review of Systems  Constitutional: Negative.   Gastrointestinal: Positive for abdominal pain and diarrhea.   Physical Exam Triage Vital Signs ED Triage Vitals  Enc Vitals Group     BP 06/06/17 1038 116/77     Pulse Rate 06/06/17 1038 78     Resp 06/06/17 1038 16     Temp 06/06/17 1038 98.4 F (36.9 C)     Temp Source 06/06/17 1038 Oral     SpO2 06/06/17 1038 100 %     Weight 06/06/17 1035 226 lb (102.5 kg)     Height 06/06/17 1035 5\' 7"  (1.702 m)     Head Circumference --      Peak Flow --      Pain Score 06/06/17 1036 2     Pain Loc --      Pain Edu? --      Excl. in GC? --     Updated Vital Signs BP 116/77 (BP Location: Left Arm)   Pulse 78   Temp 98.4 F (36.9 C) (Oral)   Resp 16   Ht 5\' 7"  (1.702 m)   Wt 226 lb (102.5  kg)   LMP 05/27/2017 (Exact Date)   SpO2 100%   Breastfeeding? No   BMI 35.40 kg/m   Physical Exam  Constitutional: She is oriented to person, place, and time. She appears well-developed. No distress.  HENT:  Head: Normocephalic and atraumatic.  Eyes: Conjunctivae are normal. No scleral icterus.  Cardiovascular: Normal rate and regular rhythm.   No murmur heard. Pulmonary/Chest: Effort normal and breath sounds normal. She has no wheezes. She has no rales.  Abdominal: Soft.  Mild tenderness in the lower abdomen.  No rebound or guarding.  Neurological: She is alert and oriented to person, place, and time.  Psychiatric: Her behavior is normal.  Flat affect.  Vitals reviewed.  UC Treatments / Results  Labs (all labs ordered are listed, but only abnormal results are displayed) Labs Reviewed - No data to display  EKG  EKG Interpretation None       Radiology No results found.  Procedures Procedures (including critical care time)  Medications Ordered in UC Medications - No data to display   Initial  Impression / Assessment and Plan / UC Course  I have reviewed the triage vital signs and the nursing notes.  Pertinent labs & imaging results that were available during my care of the patient were reviewed by me and considered in my medical decision making (see chart for details).    36 year old female with a history of Crohn's disease and a prior history of C. difficile colitis presents with worsening diarrhea.  Etiology unclear at this time.  Arranging for stool studies.  Final Clinical Impressions(s) / UC Diagnoses   Final diagnoses:  Diarrhea, unspecified type   New Prescriptions New Prescriptions   No medications on file   Controlled Substance Prescriptions [place] Controlled Substance Registry consulted? Not Applicable   Tommie Sams, DO 06/06/17 1143

## 2017-07-02 ENCOUNTER — Encounter: Payer: Self-pay | Admitting: Emergency Medicine

## 2017-07-02 ENCOUNTER — Ambulatory Visit
Admission: EM | Admit: 2017-07-02 | Discharge: 2017-07-02 | Disposition: A | Discharge status: Home / Self Care | Payer: 59 | Attending: Family Medicine | Admitting: Family Medicine

## 2017-07-02 ENCOUNTER — Other Ambulatory Visit: Payer: Self-pay

## 2017-07-02 DIAGNOSIS — M546 Pain in thoracic spine: Secondary | ICD-10-CM

## 2017-07-02 MED ORDER — CYCLOBENZAPRINE HCL 10 MG PO TABS: 10.0000 mg | ORAL_TABLET | Freq: Three times a day (TID) | ORAL | 0 refills | Status: DC | PRN

## 2017-07-02 NOTE — Discharge Instructions (Signed)
Flexeril. Heat, rest.  PRN Ibuprofen (sparingly).  Take care  Dr. Adriana Simas

## 2017-07-02 NOTE — ED Triage Notes (Addendum)
Patient in tonight c/o 3-4 days history of upper back pain, between her shoulder blades. Patient is not able to raise her arms very high. No injury noted. Patient denies cough or congestion. Patient has gastroenteritis last week. Patient has tried OTC Motrin and ice.

## 2017-07-02 NOTE — ED Provider Notes (Signed)
[facility]    CSN: [id number] Arrival date & time: 07/02/17  1719     History   Chief Complaint Chief Complaint  Patient presents with  . Back Pain   HPI  36 year old female presents with back pain.  Patient reports that it started today.  Pain is located between her shoulder blades.  It started at work.  She is a surgical tech and often lifts patients.  She does not recall a specific inciting factor/injury.  With no recent fall.  Pain is bilateral.  Severe.  She reports difficulty raising her arms secondary to pain.  She took over-the-counter Motrin and had no improvement.  She is also iced the area.  She has had recent neck pain but this is resolved.  No other associated symptoms.  No other complaints at this time.  Past Medical History:  Diagnosis Date  . Crohn disease [facility])    Past Surgical History:  Procedure Laterality Date  . DILATION AND EVACUATION N/A 06/07/2016   Procedure: DILATATION AND EVACUATION;  Surgeon: Christeen Douglas, MD;  Location: [facility] ORS;  Service: Gynecology;  Laterality: N/A;  . KNEE ARTHROSCOPY Left    x2  . LIPOMA EXCISION     back  . TONSILLECTOMY     OB History    Gravida Para Term Preterm AB Living   1             SAB TAB Ectopic Multiple Live Births                 Home Medications    Prior to Admission medications   Medication Sig Start Date End Date Taking? Authorizing Provider  ibuprofen (ADVIL,MOTRIN) 600 MG tablet Take 1 tablet (600 mg total) by mouth every 6 (six) hours as needed. 12/20/16  Yes Domenick Gong, MD  cyclobenzaprine (FLEXERIL) 10 MG tablet Take 1 tablet (10 mg total) by mouth 3 (three) times daily as needed for muscle spasms. 07/02/17   Tommie Sams, DO   Family History Family History  Problem Relation Age of Onset  . Healthy Mother   . COPD Father   . Diabetes Father   . Heart disease Father   . Hypertension Father     Social History Social History   Tobacco Use  . Smoking status: Never  Smoker  . Smokeless tobacco: Never Used  Substance Use Topics  . Alcohol use: No  . Drug use: No     Allergies   Lortab [hydrocodone-acetaminophen] and Percocet [oxycodone-acetaminophen]   Review of Systems Review of Systems  Constitutional: Negative.   Musculoskeletal: Positive for back pain.       Pain with arm ROM.   Physical Exam Triage Vital Signs ED Triage Vitals  Enc Vitals Group     BP 07/02/17 1742 113/68     Pulse Rate 07/02/17 1742 77     Resp 07/02/17 1742 16     Temp 07/02/17 1742 97.7 F (36.5 C)     Temp Source 07/02/17 1742 Oral     SpO2 07/02/17 1742 100 %     Weight 07/02/17 1742 210 lb (95.3 kg)     Height 07/02/17 1742 5\' 7"  (1.702 m)     Head Circumference --      Peak Flow --      Pain Score 07/02/17 1743 8     Pain Loc --      Pain Edu? --      Excl. in GC? --  No data found.  Updated Vital Signs BP 113/68 (BP Location: Left Arm)   Pulse 77   Temp 97.7 F (36.5 C) (Oral)   Resp 16   Ht 5\' 7"  (1.702 m)   Wt 210 lb (95.3 kg)   LMP 06/22/2017 (Exact Date)   SpO2 100%   BMI 32.89 kg/m   Physical Exam  Constitutional: She is oriented to person, place, and time. She appears well-developed. No distress.  Cardiovascular: Normal rate and regular rhythm.  No murmur heard. Pulmonary/Chest: Effort normal and breath sounds normal. She has no wheezes. She has no rales.  Musculoskeletal:  Thoracic spine - bilateral paraspinal muscle spasm (R>L).  Decreased ROM.  Neurological: She is alert and oriented to person, place, and time.  Psychiatric: She has a normal mood and affect. Her behavior is normal.  Vitals reviewed.  UC Treatments / Results  Labs (all labs ordered are listed, but only abnormal results are displayed) Labs Reviewed - No data to display  EKG  EKG Interpretation None       Radiology No results found.  Procedures Procedures (including critical care time)  Medications Ordered in UC Medications - No data to  display   Initial Impression / Assessment and Plan / UC Course  I have reviewed the triage vital signs and the nursing notes.  Pertinent labs & imaging results that were available during my care of the patient were reviewed by me and considered in my medical decision making (see chart for details).     36 year old female presents with back pain.  This is musculoskeletal in nature.  Significant spasm noted.  Treating with Flexeril.  Advised rest, heat.  Work note given.  Final Clinical Impressions(s) / UC Diagnoses   Final diagnoses:  Acute bilateral thoracic back pain    ED Discharge Orders        Ordered    cyclobenzaprine (FLEXERIL) 10 MG tablet  3 times daily PRN     07/02/17 1809     Controlled Substance Prescriptions [place] Controlled Substance Registry consulted? Not Applicable   Tommie SamsCook, Idali Lafever G, DO 07/02/17 1827

## 2017-07-10 ENCOUNTER — Ambulatory Visit: Payer: Self-pay | Admitting: Emergency Medicine

## 2017-07-11 DIAGNOSIS — S134XXA Sprain of ligaments of cervical spine, initial encounter: Secondary | ICD-10-CM | POA: Diagnosis not present

## 2017-07-11 DIAGNOSIS — Y999 Unspecified external cause status: Secondary | ICD-10-CM | POA: Diagnosis not present

## 2017-07-11 DIAGNOSIS — R202 Paresthesia of skin: Secondary | ICD-10-CM | POA: Diagnosis not present

## 2017-07-11 DIAGNOSIS — X58XXXA Exposure to other specified factors, initial encounter: Secondary | ICD-10-CM | POA: Diagnosis not present

## 2017-08-13 HISTORY — PX: ECTOPIC PREGNANCY SURGERY: SHX613

## 2017-10-14 LAB — HM PAP SMEAR: HM Pap smear: NEGATIVE

## 2017-10-14 LAB — RESULTS CONSOLE HPV: CHL HPV: NEGATIVE

## 2018-01-14 ENCOUNTER — Encounter: Payer: Self-pay | Admitting: Family Medicine

## 2018-01-14 ENCOUNTER — Ambulatory Visit (INDEPENDENT_AMBULATORY_CARE_PROVIDER_SITE_OTHER): Payer: Self-pay | Admitting: Family Medicine

## 2018-01-14 VITALS — BP 120/82 | HR 83 | Temp 98.2°F | Wt 216.0 lb

## 2018-01-14 DIAGNOSIS — H9202 Otalgia, left ear: Secondary | ICD-10-CM

## 2018-01-14 DIAGNOSIS — J0110 Acute frontal sinusitis, unspecified: Secondary | ICD-10-CM

## 2018-01-14 MED ORDER — AMOXICILLIN-POT CLAVULANATE 875-125 MG PO TABS: 1.0000 | ORAL_TABLET | Freq: Two times a day (BID) | ORAL | 0 refills | Status: DC

## 2018-01-14 MED ORDER — IPRATROPIUM BROMIDE 0.03 % NA SOLN: 2.0000 | Freq: Two times a day (BID) | NASAL | 0 refills | Status: DC

## 2018-01-14 MED ORDER — FLUCONAZOLE 150 MG PO TABS: 150.0000 mg | ORAL_TABLET | Freq: Once | ORAL | 0 refills | Status: AC

## 2018-01-14 NOTE — Patient Instructions (Signed)

## 2018-01-14 NOTE — Progress Notes (Signed)
Patient ID: Erin Hull, female    DOB: 04/29/1981, 37 y.o.   MRN: [id number]  [facility]  Chief Complaint  Patient presents with  . Sinus Problem    Subjective:  HPI QUETZALY Hull is a 37 y.o. female presents for evaluation sinus problems gradually worsening over the course of 4 days. Symptoms include frontal headache, facial and teeth pain, left ear pain, dizziness with bending over, post nasal drainage, and mild throat discomfort. She has remained afebrile. She has attempted relief with antihistamine,motrin, and warm water gargles without resolution of symptoms. Social History   Socioeconomic History  . Marital status: Legally Separated    Spouse name: Not on file  . Number of children: Not on file  . Years of education: Not on file  . Highest education level: Not on file  Occupational History  . Not on file  Social Needs  . Financial resource strain: Not on file  . Food insecurity:    Worry: Not on file    Inability: Not on file  . Transportation needs:    Medical: Not on file    Non-medical: Not on file  Tobacco Use  . Smoking status: Never Smoker  . Smokeless tobacco: Never Used  Substance and Sexual Activity  . Alcohol use: No  . Drug use: No  . Sexual activity: Not on file  Lifestyle  . Physical activity:    Days per week: Not on file    Minutes per session: Not on file  . Stress: Not on file  Relationships  . Social connections:    Talks on phone: Not on file    Gets together: Not on file    Attends religious service: Not on file    Active member of club or organization: Not on file    Attends meetings of clubs or organizations: Not on file    Relationship status: Not on file  . Intimate partner violence:    Fear of current or ex partner: Not on file    Emotionally abused: Not on file    Physically abused: Not on file    Forced sexual activity: Not on file  Other Topics Concern  . Not on file  Social History Narrative  . Not on  file    Family History  Problem Relation Age of Onset  . Healthy Mother   . COPD Father   . Diabetes Father   . Heart disease Father   . Hypertension Father    Review of Systems Pertinent negatives listed in HPI There are no active problems to display for this patient.   Allergies  Allergen Reactions  . Lortab [Hydrocodone-Acetaminophen] Hives  . Percocet [Oxycodone-Acetaminophen] Hives    Prior to Admission medications   Medication Sig Start Date End Date Taking? Authorizing Provider  cyclobenzaprine (FLEXERIL) 10 MG tablet Take 1 tablet (10 mg total) by mouth 3 (three) times daily as needed for muscle spasms. 07/02/17  Yes Cook, Jayce G, DO  ibuprofen (ADVIL,MOTRIN) 600 MG tablet Take 1 tablet (600 mg total) by mouth every 6 (six) hours as needed. 12/20/16  Yes Domenick Gong, MD    Past Medical, Surgical Family and Social History reviewed and updated.    Objective:   Today's Vitals   01/14/18 1129  BP: 120/82  Pulse: 83  Temp: 98.2 F (36.8 C)  SpO2: 98%  Weight: 216 lb (98 kg)    Wt Readings from Last 3 Encounters:  01/14/18 216 lb (98 kg)  07/02/17 210 lb (95.3 kg)  06/06/17 226 lb (102.5 kg)   Physical Exam  Constitutional: She is oriented to person, place, and time. She appears well-developed and well-nourished.  HENT:  Head: Normocephalic and atraumatic.  Left Ear: There is tenderness. Tympanic membrane is erythematous.  Nose: Mucosal edema present. Right sinus exhibits frontal sinus tenderness. Left sinus exhibits frontal sinus tenderness.  Mouth/Throat: Uvula is midline. Mucous membranes are dry.  Eyes: Pupils are equal, round, and reactive to light. EOM are normal.  Cardiovascular: Normal rate and regular rhythm.  Pulmonary/Chest: Effort normal and breath sounds normal.  Neurological: She is alert and oriented to person, place, and time.  Skin: Skin is warm and dry.  Psychiatric: She has a normal mood and affect. Her behavior is normal.  Judgment and thought content normal.  Vitals reviewed.  Assessment & Plan:  .1. Acute frontal sinusitis, recurrence not specified 2. Left ear pain Start empiric treatment with Augmentin 1 tablet twice daily with food. Will trial Atrovent nasal spray for decongestant effects.  If symptoms worsen or do not improve, return for follow-up, follow-up with PCP, or at the emergency department if severity of symptoms warrant a higher level of care.    Meds ordered this encounter  Medications  . ipratropium (ATROVENT) 0.03 % nasal spray    Sig: Place 2 sprays into both nostrils 2 (two) times daily.    Dispense:  30 mL    Refill:  0  . fluconazole (DIFLUCAN) 150 MG tablet    Sig: Take 1 tablet (150 mg total) by mouth once for 1 dose.    Dispense:  1 tablet    Refill:  0  . amoxicillin-clavulanate (AUGMENTIN) 875-125 MG tablet    Sig: Take 1 tablet by mouth 2 (two) times daily.    Dispense:  20 tablet    Refill:  0     Godfrey Pick. Tiburcio Pea, MSN, FNP-C [facility]  [address]  [place], [place] [address] [phone]

## 2018-02-26 ENCOUNTER — Ambulatory Visit (INDEPENDENT_AMBULATORY_CARE_PROVIDER_SITE_OTHER): Payer: Self-pay | Admitting: Family Medicine

## 2018-02-26 ENCOUNTER — Encounter: Payer: Self-pay | Admitting: Family Medicine

## 2018-02-26 VITALS — BP 110/80 | HR 88 | Temp 98.6°F | Wt 218.0 lb

## 2018-02-26 DIAGNOSIS — H6692 Otitis media, unspecified, left ear: Secondary | ICD-10-CM

## 2018-02-26 MED ORDER — CIPROFLOXACIN-DEXAMETHASONE 0.3-0.1 % OT SUSP: 4.0000 [drp] | Freq: Two times a day (BID) | OTIC | 0 refills | Status: AC

## 2018-02-26 NOTE — Patient Instructions (Signed)
Ear Drops, Adult You have been diagnosed with a condition that requires you to put drops of medicine into your ears. Ear drops are a medicine that is placed in the ear. This sheet gives you information about how to use ear drops. Your health care provider may also give you more specific instructions. Supplies needed:  Cotton ball.  Medicine. How to put ear drops into your ear 1. Wash your hands thoroughly with soap and water. 2. Make sure your ears are clean and dry. If there is any ear wax or drainage at the outermost portion of the ear canal, wipe it out gently with a cotton-tipped applicator. 3. Warm up the medicine by holding it in the palm of your hand for a few minutes. 4. Shake the medicine if it is a suspension. 5. Use the dropper to draw up the medicine. 6. Hold the dropper above your ear canal and put the drops in the affected ear as instructed. Do not put the dropper into your ear at any time. It may help to pull the outer flap of the ear up and back while you put the drops in. Doing this will straighten out the ear canal so the medicine can get into the canal easier. 7. To make sure your ear soaks up the medicine, do either of these things: ? Lie down with the affected ear facing up for 10 minutes. This will cause the drops to stay in the ear canal and run down and fill the canal. ? Gently put a cotton ball in your ear canal. Leave enough of the cotton ball out so it can be easily removed. Do not push the cotton ball down into your ear with a cotton-tipped swab or other instrument. You can remove the cotton ball once the medicine has been absorbed. 8. If both ears need the drops, repeat the procedure for the other ear. Your health care provider will let you know if you need to put drops in both ears. Follow these instructions at home:  Use the ear drops for as long as directed by your health care provider, even if you begin to feel better.  Always wash your hands before and after  handling the ear drops.  Keep the ear drops at room temperature.  Keep all follow-up visits as told by your health care provider. This is important. Contact a health care provider if:  Your condition gets worse.  Your pain gets worse.  You notice any unusual drainage from your ear, especially if the drainage has a bad smell.  You have trouble hearing.  You have used the ear drops for the amount of time recommended by your health care provider, but your symptoms have not improved. Get help right away if:  You experience a form of dizziness in which you feel as if the room is spinning and you feel nauseated (vertigo).  The outside of your ear becomes red or swollen.  You develop a severe headache with or without neck stiffness. Summary  Ear drops are a medicine that is placed in the ear.  Put drops in the affected ear as instructed.  Use the ear drops for as long as directed by your health care provider, even if your symptoms begin to get better.  Keep all follow-up visits as told by your health care provider. This is important. This information is not intended to replace advice given to you by your health care provider. Make sure you discuss any questions you have with your   health care provider. Document Released: 07/24/2001 Document Revised: 08/02/2016 Document Reviewed: 08/02/2016 Elsevier Interactive Patient Education  2017 Elsevier Inc.  

## 2018-02-26 NOTE — Progress Notes (Signed)
Patient ID: Erin Hull, female    DOB: [birth year], 37 y.o.   MRN: [id number]  [facility]  Chief Complaint  Patient presents with  . focus-left ear pain    Subjective:  HPI Erin Hull is a 37 y.o. female presents for evaluation or left ear pain x 2 days. Patient was treated 6 weeks ago for acute sinusitis and during that time was treated for infection a concurrent suspected ear infection. Today she complains of acute onset inner ear pain. She has remained negative of ear drainage. Reports mild sinus pressure, otherwise, negative of other URI symptoms or fever.   Social History   Socioeconomic History  . Marital status: Legally Separated    Spouse name: Not on file  . Number of children: Not on file  . Years of education: Not on file  . Highest education level: Not on file  Occupational History  . Not on file  Social Needs  . Financial resource strain: Not on file  . Food insecurity:    Worry: Not on file    Inability: Not on file  . Transportation needs:    Medical: Not on file    Non-medical: Not on file  Tobacco Use  . Smoking status: Never Smoker  . Smokeless tobacco: Never Used  Substance and Sexual Activity  . Alcohol use: No  . Drug use: No  . Sexual activity: Not on file  Lifestyle  . Physical activity:    Days per week: Not on file    Minutes per session: Not on file  . Stress: Not on file  Relationships  . Social connections:    Talks on phone: Not on file    Gets together: Not on file    Attends religious service: Not on file    Active member of club or organization: Not on file    Attends meetings of clubs or organizations: Not on file    Relationship status: Not on file  . Intimate partner violence:    Fear of current or ex partner: Not on file    Emotionally abused: Not on file    Physically abused: Not on file    Forced sexual activity: Not on file  Other Topics Concern  . Not on file  Social History Narrative  . Not on  file    Family History  Problem Relation Age of Onset  . Healthy Mother   . COPD Father   . Diabetes Father   . Heart disease Father   . Hypertension Father    Review of Systems Pertinent negatives listed in HPI There are no active problems to display for this patient.   Allergies  Allergen Reactions  . Lortab [Hydrocodone-Acetaminophen] Hives  . Percocet [Oxycodone-Acetaminophen] Hives    Prior to Admission medications   Medication Sig Start Date End Date Taking? Authorizing Provider  cyclobenzaprine (FLEXERIL) 10 MG tablet Take 1 tablet (10 mg total) by mouth 3 (three) times daily as needed for muscle spasms. 07/02/17  Yes Cook, Jayce G, DO  ibuprofen (ADVIL,MOTRIN) 600 MG tablet Take 1 tablet (600 mg total) by mouth every 6 (six) hours as needed. 12/20/16  Yes Domenick Gong, MD  ipratropium (ATROVENT) 0.03 % nasal spray Place 2 sprays into both nostrils 2 (two) times daily. 01/14/18  Yes Bing Neighbors, FNP  amoxicillin-clavulanate (AUGMENTIN) 875-125 MG tablet Take 1 tablet by mouth 2 (two) times daily. Patient not taking: Reported on 02/26/2018 01/14/18   Bing Neighbors, FNP  ciprofloxacin-dexamethasone (CIPRODEX) OTIC suspension Place 4 drops into the left ear 2 (two) times daily for 7 days. 02/26/18 03/05/18  Bing Neighbors, FNP    Past Medical, Surgical Family and Social History reviewed and updated.    Objective:   Today's Vitals   02/26/18 1549  BP: 110/80  Pulse: 88  Temp: 98.6 F (37 C)  SpO2: 98%  Weight: 218 lb (98.9 kg)  PainSc: 6   PainLoc: Ear    Wt Readings from Last 3 Encounters:  02/26/18 218 lb (98.9 kg)  01/14/18 216 lb (98 kg)  07/02/17 210 lb (95.3 kg)   Physical Exam  Constitutional: She is oriented to person, place, and time.  HENT:  Right Ear: Hearing, tympanic membrane, external ear and ear canal normal. Tympanic membrane is not erythematous and not bulging. No middle ear effusion.  Left Ear: There is tenderness. Tympanic  membrane is erythematous and bulging. A middle ear effusion is present.  Cardiovascular: Normal rate and regular rhythm.  Pulmonary/Chest: Effort normal and breath sounds normal.  Neurological: She is alert and oriented to person, place, and time.  Skin: Skin is warm and dry.   Assessment & Plan:  1. Otitis of left ear, recurrent. Treated for left ear infection with a coexisting sinus infection 6 weeks prior which has recurred. Will trial a 7 day course of Ciprodex. If no improvement recommend PCP follow-up.  If symptoms worsen or do not improve, return for follow-up, follow-up with PCP, or at the emergency department if severity of symptoms warrant a higher level of care.     Godfrey Pick. Tiburcio Pea, MSN, FNP-C [facility]  [address]  [place], [place] [address] [phone]

## 2018-03-03 ENCOUNTER — Telehealth: Payer: Self-pay | Admitting: Emergency Medicine

## 2018-05-13 DIAGNOSIS — O009 Unspecified ectopic pregnancy without intrauterine pregnancy: Secondary | ICD-10-CM

## 2018-05-13 HISTORY — DX: Unspecified ectopic pregnancy without intrauterine pregnancy: O00.90

## 2019-06-22 ENCOUNTER — Emergency Department
Admission: EM | Admit: 2019-06-22 | Discharge: 2019-06-22 | Disposition: A | Discharge status: Home / Self Care | Payer: No Typology Code available for payment source | Attending: Emergency Medicine | Admitting: Emergency Medicine

## 2019-06-22 ENCOUNTER — Emergency Department: Payer: No Typology Code available for payment source

## 2019-06-22 ENCOUNTER — Encounter: Payer: Self-pay | Admitting: Emergency Medicine

## 2019-06-22 ENCOUNTER — Other Ambulatory Visit: Payer: Self-pay

## 2019-06-22 DIAGNOSIS — Z79899 Other long term (current) drug therapy: Secondary | ICD-10-CM | POA: Diagnosis not present

## 2019-06-22 DIAGNOSIS — M25562 Pain in left knee: Secondary | ICD-10-CM | POA: Diagnosis present

## 2019-06-22 MED ORDER — TRAMADOL HCL 50 MG PO TABS: 50.0000 mg | ORAL_TABLET | Freq: Four times a day (QID) | ORAL | 0 refills | Status: AC | PRN

## 2019-06-22 NOTE — ED Provider Notes (Signed)
[facility] Emergency Department Provider Note  ____________________________________________   None    (approximate)   I have reviewed the triage vital signs and the nursing notes.   Patient has been triaged with a MSE exam performed by myself at a minimum. Based on symptoms and screening exam, patient may receive a more in-depth exam, labs, imaging as detailed below. Patients have been advised of this setting and exam type at the time of patient interview.    HISTORY  Chief Complaint Knee Pain    HPI Erin Hull is a 38 y.o. female presents to the emergency department with a complaint of left knee pain, states hx of 2 knee surgeries, now having more pain and feels like the knee is locking up, no new injury, no numbness or tingling, no fever or chills.   Patient will receive a medical screening exam as detailed below.  Based off of this exam, more in depth exam, labs, imaging will be performed as needed for complaint.  Patient care will be eventually transferred to another provider in the emergency department for final exam, diagnosis and disposition.    Past Medical History:  Diagnosis Date  . Crohn disease (HCC)     There are no active problems to display for this patient.   Past Surgical History:  Procedure Laterality Date  . DILATION AND EVACUATION N/A 06/07/2016   Procedure: DILATATION AND EVACUATION;  Surgeon: Christeen Douglas, MD;  Location: [facility] ORS;  Service: Gynecology;  Laterality: N/A;  . KNEE ARTHROSCOPY Left    x2  . LIPOMA EXCISION     back  . TONSILLECTOMY      Prior to Admission medications   Medication Sig Start Date End Date Taking? Authorizing Provider  amoxicillin-clavulanate (AUGMENTIN) 875-125 MG tablet Take 1 tablet by mouth 2 (two) times daily. Patient not taking: Reported on 02/26/2018 01/14/18   Bing Neighbors, FNP  cyclobenzaprine (FLEXERIL) 10 MG tablet Take 1 tablet (10 mg total) by mouth 3 (three)  times daily as needed for muscle spasms. 07/02/17   Tommie Sams, DO  ibuprofen (ADVIL,MOTRIN) 600 MG tablet Take 1 tablet (600 mg total) by mouth every 6 (six) hours as needed. 12/20/16   Domenick Gong, MD  ipratropium (ATROVENT) 0.03 % nasal spray Place 2 sprays into both nostrils 2 (two) times daily. 01/14/18   Bing Neighbors, FNP    Allergies Lortab [hydrocodone-acetaminophen] and Percocet [oxycodone-acetaminophen]  Family History  Problem Relation Age of Onset  . Healthy Mother   . COPD Father   . Diabetes Father   . Heart disease Father   . Hypertension Father     Social History Social History   Tobacco Use  . Smoking status: Never Smoker  . Smokeless tobacco: Never Used  Substance Use Topics  . Alcohol use: No  . Drug use: No    Review of Systems Constitutional: no fever ENT: no nasal congestion/rhinorhea. no sore throat Cardiovascular: no chest pain. Respiratory: no cough. no shortness of breath/difficulty breathing Gastroenterology: no abdominal pain Musculoskeletal: positive for musculoskeletal pain Integumentary: Negative for rash. Neurological: No focal weakness nor numbness.   ____________________________________________   PHYSICAL EXAM:  VITAL SIGNS: ED Triage Vitals [06/22/19 1426]  Enc Vitals Group     BP      Pulse      Resp      Temp      Temp src      SpO2      Weight 220 lb (  99.8 kg)     Height 5\' 7"  (1.702 m)     Head Circumference      Peak Flow      Pain Score 9     Pain Loc      Pain Edu?      Excl. in [place]?     Constitutional: Alert and oriented. Generally well appearing and in no acute distress. Eyes: Conjunctivae are normal.  Nose: No significant congestion/rhinnorhea. Mouth: No gross oropharyngeal edema.   Neck: No stridor.  No meningeal signs.   Cardiovascular: Grossly normal heart sounds. Respiratory: Normal respiratory effort without significant tachypnea and no observed retractions. Lungs c t q Musculoskeletal:  No gross deformities of extremities.left knee is tender at joint line, pain reproduced with extension, n/v intact Neurologic:  Normal speech and language. No gross focal neurologic deficits are appreciated.  Skin:  Skin is warm, dry and intact. No rash noted.    ____________________________________________   LABS (all labs ordered are listed, but only abnormal results are displayed)  Labs Reviewed - No data to display  ____________________________________________   RADIOLOGY Xray of left knee  Official radiology report(s): No results found.  ____________________________________________    INITIAL IMPRESSION / MDM / ASSESSMENT AND PLAN / ED COURSE  As part of my medical decision making, I reviewed the following data within the [facility] notes reviewed and incorporated, Notes from prior ED visits and [place] Controlled Substance Database      Clinical Impression: left knee pain, chronic   Plan: xray, d/c home with f/u  Patient has been screened based based on their arrival complaint, evaluated for an emergent condition, and at a minimum has received a medical screening exam.  At this time, patient will receive further work-up as determined by medical screening exam.  Patient care will eventually be transferred to another provider in the emergency department for final diagnosis and disposition.    ____________________________________________  Note:  This document was prepared using Systems analyst and may include unintentional dictation errors.    Versie Starks, PA-C 06/22/19 1431    Earleen Newport, MD 06/22/19 1535

## 2019-06-22 NOTE — ED Provider Notes (Signed)
[facility] Emergency Department Provider Note  Time seen: 3:19 PM  I have reviewed the triage vital signs and the nursing notes.   HISTORY  Chief Complaint Knee Pain   HPI Erin Hull is a 38 y.o. female with a past medical history of Crohn's disease presents to the emergency department for left knee pain.  According to the patient for months or years she has been experiencing pain in the left knee.  States she had an injection performed in the knee October 1 which did help for several weeks but then the pain has been worsening once again.  Has followed up with orthopedics since then has an MRI scheduled but has not yet had it performed.  Patient has a history of a meniscal injury in the knee previously states it has been clicking on occasion when she flexes the knee and has become very painful over the last couple days to walk on the knee.  Denies any fever.  No redness or swelling of the knee.  No traumatic injuries to the knee.  Patient also denies any fever cough congestion or shortness of breath.   Past Medical History:  Diagnosis Date  . Crohn disease ([place])     There are no active problems to display for this patient.   Past Surgical History:  Procedure Laterality Date  . DILATION AND EVACUATION N/A 06/07/2016   Procedure: DILATATION AND EVACUATION;  Surgeon: Benjaman Kindler, MD;  Location: [facility] ORS;  Service: Gynecology;  Laterality: N/A;  . KNEE ARTHROSCOPY Left    x2  . LIPOMA EXCISION     back  . TONSILLECTOMY      Prior to Admission medications   Medication Sig Start Date End Date Taking? Authorizing Provider  ibuprofen (ADVIL,MOTRIN) 600 MG tablet Take 1 tablet (600 mg total) by mouth every 6 (six) hours as needed. 12/20/16   Melynda Ripple, MD  ipratropium (ATROVENT) 0.03 % nasal spray Place 2 sprays into both nostrils 2 (two) times daily. 01/14/18   Scot Jun, FNP    Allergies  Allergen Reactions  . Lortab  [Hydrocodone-Acetaminophen] Hives  . Percocet [Oxycodone-Acetaminophen] Hives    Family History  Problem Relation Age of Onset  . Healthy Mother   . COPD Father   . Diabetes Father   . Heart disease Father   . Hypertension Father     Social History Social History   Tobacco Use  . Smoking status: Never Smoker  . Smokeless tobacco: Never Used  Substance Use Topics  . Alcohol use: No  . Drug use: No    Review of Systems Constitutional: Negative for fever. Cardiovascular: Negative for chest pain. Respiratory: Negative for shortness of breath. Gastrointestinal: Negative for abdominal pain Musculoskeletal: Left knee pain Neurological: Negative for headache All other ROS negative  ____________________________________________   PHYSICAL EXAM:  VITAL SIGNS: ED Triage Vitals  Enc Vitals Group     BP 06/22/19 1428 126/72     Pulse Rate 06/22/19 1428 83     Resp 06/22/19 1428 20     Temp 06/22/19 1428 98.4 F (36.9 C)     Temp Source 06/22/19 1428 Oral     SpO2 06/22/19 1428 99 %     Weight 06/22/19 1426 220 lb (99.8 kg)     Height 06/22/19 1426 5\' 7"  (1.702 m)     Head Circumference --      Peak Flow --      Pain Score 06/22/19 1426 9  Pain Loc --      Pain Edu? --      Excl. in [place]? --    Constitutional: Alert and oriented. Well appearing and in no distress. Eyes: Normal exam ENT      Head: Normocephalic and atraumatic      Mouth/Throat: Mucous membranes are moist. Cardiovascular: Normal rate, regular rhythm.  Respiratory: Normal respiratory effort without tachypnea nor retractions. Breath sounds are clear  Gastrointestinal: Soft and nontender. No distention.  Musculoskeletal: Left knee pain with range of motion, mild.  No tenderness palpation.  No deformity.  No obvious effusion palpated. Neurologic:  Normal speech and language. No gross focal neurologic deficits  Skin:  Skin is warm, dry and intact.  Psychiatric: Mood and affect are  normal  ____________________________________________     RADIOLOGY  I have reviewed the x-ray images, no acute abnormality on my review.  ____________________________________________   INITIAL IMPRESSION / ASSESSMENT AND PLAN / ED COURSE  Pertinent labs & imaging results that were available during my care of the patient were reviewed by me and considered in my medical decision making (see chart for details).   Patient presents to the emergency department for left knee pain which has been ongoing for several months or longer.  X-ray shows no acute abnormalities.  No trauma recently.  Patient has already followed up with orthopedics and is scheduled for an MRI which I believe is the appropriate next step of care.  Until that time we will place the patient is on crutches with weightbearing as tolerated I recommend the patient obtain a compression sleeve and we will discharge with a short course of Ultram.  Patient agreeable to plan of care.  Erin Hull was evaluated in Emergency Department on 06/22/2019 for the symptoms described in the history of present illness. She was evaluated in the context of the global COVID-19 pandemic, which necessitated consideration that the patient might be at risk for infection with the SARS-CoV-2 virus that causes COVID-19. Institutional protocols and algorithms that pertain to the evaluation of patients at risk for COVID-19 are in a state of rapid change based on information released by regulatory bodies including the CDC and federal and state organizations. These policies and algorithms were followed during the patient's care in the ED.  ____________________________________________   FINAL CLINICAL IMPRESSION(S) / ED DIAGNOSES  Left knee pain   Minna Antis, MD 06/22/19 [phone]

## 2019-06-22 NOTE — ED Triage Notes (Signed)
Pt reports ongoing issues with her left knee. Pt states has been seen at [facility] and is scheduled for a MRI but has not gotten it yet.

## 2019-07-08 ENCOUNTER — Ambulatory Visit
Admission: RE | Admit: 2019-07-08 | Discharge: 2019-07-08 | Disposition: A | Discharge status: Home / Self Care | Payer: Self-pay | Source: Ambulatory Visit | Attending: Orthopedic Surgery | Admitting: Orthopedic Surgery

## 2019-07-08 ENCOUNTER — Other Ambulatory Visit: Payer: Self-pay | Admitting: Orthopedic Surgery

## 2019-07-08 DIAGNOSIS — M7122 Synovial cyst of popliteal space [Baker], left knee: Secondary | ICD-10-CM

## 2019-07-13 ENCOUNTER — Other Ambulatory Visit: Payer: Self-pay | Admitting: Orthopedic Surgery

## 2019-07-13 DIAGNOSIS — M7122 Synovial cyst of popliteal space [Baker], left knee: Secondary | ICD-10-CM

## 2019-07-15 ENCOUNTER — Ambulatory Visit
Admission: RE | Admit: 2019-07-15 | Discharge: 2019-07-15 | Disposition: A | Discharge status: Home / Self Care | Payer: BC Managed Care – PPO | Source: Ambulatory Visit | Attending: Orthopedic Surgery | Admitting: Orthopedic Surgery

## 2019-07-15 ENCOUNTER — Other Ambulatory Visit: Payer: Self-pay

## 2019-07-15 DIAGNOSIS — M7122 Synovial cyst of popliteal space [Baker], left knee: Secondary | ICD-10-CM | POA: Insufficient documentation

## 2019-07-15 MED ORDER — METHYLPREDNISOLONE ACETATE 80 MG/ML IJ SUSP
INTRAMUSCULAR | Status: AC
  Administered 2019-07-15: 14:00:00 80 mg
  Filled 2019-07-15: qty 1

## 2019-07-15 NOTE — Procedures (Signed)
Pre Procedure Dx: Symptomatic left sided Baker's Cyst Post Procedural Dx: Same  Successful ultrasound-guided steroid injection and aspiration approximately 3 cc of serous, slightly viscous fluid from the dominant cystic component of the otherwise unchanged small cephalad dissecting left sided Baker's cyst.   EBL: None No immediate complications.   Ronny Bacon, MD Pager #: [phone]

## 2019-08-21 DIAGNOSIS — M7122 Synovial cyst of popliteal space [Baker], left knee: Secondary | ICD-10-CM | POA: Diagnosis not present

## 2020-01-26 ENCOUNTER — Other Ambulatory Visit: Payer: Self-pay

## 2020-01-26 ENCOUNTER — Other Ambulatory Visit: Payer: Self-pay | Admitting: *Deleted

## 2020-01-26 ENCOUNTER — Telehealth: Payer: Self-pay | Admitting: Urology

## 2020-01-26 ENCOUNTER — Other Ambulatory Visit: Payer: 59

## 2020-01-26 DIAGNOSIS — R109 Unspecified abdominal pain: Secondary | ICD-10-CM | POA: Diagnosis not present

## 2020-01-26 LAB — URINALYSIS, COMPLETE
Bilirubin, UA: NEGATIVE
Glucose, UA: NEGATIVE
Leukocytes,UA: NEGATIVE
Nitrite, UA: NEGATIVE
Protein,UA: NEGATIVE
Specific Gravity, UA: >1.03 — ABNORMAL HIGH (ref 1.005–1.030)
Urobilinogen, Ur: 0.2 mg/dL (ref 0.2–1.0)
pH, UA: 6 (ref 5.0–7.5)

## 2020-01-26 LAB — MICROSCOPIC EXAMINATION

## 2020-01-26 NOTE — Telephone Encounter (Signed)
Patient has developed right flank pain, concerned about UTI.  Requesting urine drop-off.  We will arrange for UA/urine culture today.  Vanna Scotland, MD

## 2020-01-29 LAB — CULTURE, URINE COMPREHENSIVE

## 2020-02-12 ENCOUNTER — Ambulatory Visit
Admission: EM | Admit: 2020-02-12 | Discharge: 2020-02-12 | Disposition: A | Discharge status: Home / Self Care | Payer: 59 | Attending: Emergency Medicine | Admitting: Emergency Medicine

## 2020-02-12 DIAGNOSIS — J069 Acute upper respiratory infection, unspecified: Secondary | ICD-10-CM

## 2020-02-12 MED ORDER — MUCINEX 600 MG PO TB12: 1200.0000 mg | ORAL_TABLET | Freq: Two times a day (BID) | ORAL | 0 refills | Status: DC | PRN

## 2020-02-12 MED ORDER — IBUPROFEN 800 MG PO TABS: 800.0000 mg | ORAL_TABLET | Freq: Three times a day (TID) | ORAL | 0 refills | Status: DC | PRN

## 2020-02-12 NOTE — ED Triage Notes (Signed)
Woke up two days ago with a scratchy throat. Reports woke up yesterday and congestion had settled in her chest. States he has frequently gotten bronchitis in the past.

## 2020-02-12 NOTE — ED Provider Notes (Signed)
Renaldo Fiddler    CSN: [id number] Arrival date & time: 02/12/20  1227      History   Chief Complaint Chief Complaint  Patient presents with  . Nasal Congestion  . Cough    HPI Erin Hull is a 39 y.o. female.   Patient presents with 2-day history of nonproductive cough, chest congestion, and hoarseness.  She also reports mild shortness of breath.  She denies fever, chills, sore throat, abdominal pain, vomiting, diarrhea, rash, or other symptoms.  No treatment attempted at home.  The history is provided by the patient.    Past Medical History:  Diagnosis Date  . Crohn disease (HCC)   . Ectopic pregnancy 05/2018    There are no problems to display for this patient.   Past Surgical History:  Procedure Laterality Date  . DILATION AND EVACUATION N/A 06/07/2016   Procedure: DILATATION AND EVACUATION;  Surgeon: Christeen Douglas, MD;  Location: [facility] ORS;  Service: Gynecology;  Laterality: N/A;  . KNEE ARTHROSCOPY Left    x2  . LIPOMA EXCISION     back  . TONSILLECTOMY      OB History    Gravida  1   Para      Term      Preterm      AB      Living        SAB      TAB      Ectopic      Multiple      Live Births               Home Medications    Prior to Admission medications   Medication Sig Start Date End Date Taking? Authorizing Provider  guaiFENesin (MUCINEX) 600 MG 12 hr tablet Take 2 tablets (1,200 mg total) by mouth 2 (two) times daily as needed. 02/12/20   Mickie Bail, NP  ibuprofen (ADVIL) 800 MG tablet Take 1 tablet (800 mg total) by mouth every 8 (eight) hours as needed. 02/12/20   Mickie Bail, NP  ipratropium (ATROVENT) 0.03 % nasal spray Place 2 sprays into both nostrils 2 (two) times daily. 01/14/18   Bing Neighbors, FNP  traMADol (ULTRAM) 50 MG tablet Take 1 tablet (50 mg total) by mouth every 6 (six) hours as needed. 06/22/19 06/21/20  Minna Antis, MD    Family History Family History  Problem Relation Age of  Onset  . Healthy Mother   . COPD Father   . Diabetes Father   . Heart disease Father   . Hypertension Father     Social History Social History   Tobacco Use  . Smoking status: Never Smoker  . Smokeless tobacco: Never Used  Vaping Use  . Vaping Use: Never used  Substance Use Topics  . Alcohol use: No  . Drug use: No     Allergies   Lortab [hydrocodone-acetaminophen] and Percocet [oxycodone-acetaminophen]   Review of Systems Review of Systems  Constitutional: Negative for chills and fever.  HENT: Positive for congestion and voice change. Negative for ear pain and sore throat.   Eyes: Negative for pain and visual disturbance.  Respiratory: Positive for cough. Negative for shortness of breath.   Cardiovascular: Negative for chest pain and palpitations.  Gastrointestinal: Negative for abdominal pain, diarrhea, nausea and vomiting.  Genitourinary: Negative for dysuria and hematuria.  Musculoskeletal: Negative for arthralgias and back pain.  Skin: Negative for color change and rash.  Neurological: Negative for seizures and syncope.  All other systems reviewed and are negative.    Physical Exam Triage Vital Signs ED Triage Vitals  Enc Vitals Group     BP 02/12/20 1235 116/81     Pulse Rate 02/12/20 1235 85     Resp 02/12/20 1235 16     Temp 02/12/20 1235 98.1 F (36.7 C)     Temp src --      SpO2 02/12/20 1235 98 %     Weight --      Height --      Head Circumference --      Peak Flow --      Pain Score 02/12/20 1229 2     Pain Loc --      Pain Edu? --      Excl. in GC? --    No data found.  Updated Vital Signs BP 116/81   Pulse 85   Temp 98.1 F (36.7 C)   Resp 16   LMP 01/29/2020 (Exact Date)   SpO2 98%   Visual Acuity Right Eye Distance:   Left Eye Distance:   Bilateral Distance:    Right Eye Near:   Left Eye Near:    Bilateral Near:     Physical Exam Vitals and nursing note reviewed.  Constitutional:      General: She is not in acute  distress.    Appearance: She is well-developed. She is not ill-appearing.  HENT:     Head: Normocephalic and atraumatic.     Right Ear: Tympanic membrane normal.     Left Ear: Tympanic membrane normal.     Nose: Nose normal.     Mouth/Throat:     Mouth: Mucous membranes are moist.     Pharynx: Oropharynx is clear.  Eyes:     Conjunctiva/sclera: Conjunctivae normal.  Cardiovascular:     Rate and Rhythm: Normal rate and regular rhythm.     Heart sounds: No murmur heard.   Pulmonary:     Effort: Pulmonary effort is normal. No respiratory distress.     Breath sounds: Normal breath sounds. No wheezing or rhonchi.  Abdominal:     Palpations: Abdomen is soft.     Tenderness: There is no abdominal tenderness. There is no guarding or rebound.  Musculoskeletal:     Cervical back: Neck supple.  Skin:    General: Skin is warm and dry.     Findings: No rash.  Neurological:     General: No focal deficit present.     Mental Status: She is alert and oriented to person, place, and time.     Gait: Gait normal.  Psychiatric:        Mood and Affect: Mood normal.        Behavior: Behavior normal.      UC Treatments / Results  Labs (all labs ordered are listed, but only abnormal results are displayed) Labs Reviewed - No data to display  EKG   Radiology No results found.  Procedures Procedures (including critical care time)  Medications Ordered in UC Medications - No data to display  Initial Impression / Assessment and Plan / UC Course  I have reviewed the triage vital signs and the nursing notes.  Pertinent labs & imaging results that were available during my care of the patient were reviewed by me and considered in my medical decision making (see chart for details).    URI.  Treating with ibuprofen and Mucinex.  Patient had a COVID test done just PTA at  [facility].  Instructed her to follow-up with her PCP if her symptoms are not improving.  Patient agrees to plan of care.       Final Clinical Impressions(s) / UC Diagnoses   Final diagnoses:  Upper respiratory tract infection, unspecified type     Discharge Instructions     Take the ibuprofen as needed for fever or discomfort.  Take the Mucinex as needed for congestion.    Follow up with your primary care provider if your symptoms are not improving.       ED Prescriptions    Medication Sig Dispense Auth. Provider   ibuprofen (ADVIL) 800 MG tablet Take 1 tablet (800 mg total) by mouth every 8 (eight) hours as needed. 21 tablet Mickie Bail, NP   guaiFENesin (MUCINEX) 600 MG 12 hr tablet Take 2 tablets (1,200 mg total) by mouth 2 (two) times daily as needed. 12 tablet Mickie Bail, NP     PDMP not reviewed this encounter.   Mickie Bail, NP 02/12/20 1301

## 2020-02-12 NOTE — Discharge Instructions (Addendum)
Take the ibuprofen as needed for fever or discomfort.  Take the Mucinex as needed for congestion.    Follow up with your primary care provider if your symptoms are not improving.

## 2020-04-27 DIAGNOSIS — Z111 Encounter for screening for respiratory tuberculosis: Secondary | ICD-10-CM | POA: Diagnosis not present

## 2020-04-29 DIAGNOSIS — Z111 Encounter for screening for respiratory tuberculosis: Secondary | ICD-10-CM | POA: Diagnosis not present

## 2020-05-06 DIAGNOSIS — Z111 Encounter for screening for respiratory tuberculosis: Secondary | ICD-10-CM | POA: Diagnosis not present

## 2020-05-09 DIAGNOSIS — Z111 Encounter for screening for respiratory tuberculosis: Secondary | ICD-10-CM | POA: Diagnosis not present

## 2020-09-16 DIAGNOSIS — Z8616 Personal history of COVID-19: Secondary | ICD-10-CM | POA: Diagnosis not present

## 2020-09-16 DIAGNOSIS — G8929 Other chronic pain: Secondary | ICD-10-CM | POA: Diagnosis not present

## 2020-09-16 DIAGNOSIS — M25511 Pain in right shoulder: Secondary | ICD-10-CM | POA: Diagnosis not present

## 2020-09-16 DIAGNOSIS — R519 Headache, unspecified: Secondary | ICD-10-CM | POA: Diagnosis not present

## 2020-09-23 DIAGNOSIS — M25562 Pain in left knee: Secondary | ICD-10-CM | POA: Diagnosis not present

## 2020-09-23 DIAGNOSIS — M549 Dorsalgia, unspecified: Secondary | ICD-10-CM | POA: Diagnosis not present

## 2020-09-23 DIAGNOSIS — M7061 Trochanteric bursitis, right hip: Secondary | ICD-10-CM | POA: Diagnosis not present

## 2020-09-23 DIAGNOSIS — Z0189 Encounter for other specified special examinations: Secondary | ICD-10-CM | POA: Diagnosis not present

## 2020-09-29 DIAGNOSIS — U099 Post covid-19 condition, unspecified: Secondary | ICD-10-CM | POA: Diagnosis not present

## 2020-09-29 DIAGNOSIS — L659 Nonscarring hair loss, unspecified: Secondary | ICD-10-CM | POA: Diagnosis not present

## 2020-09-29 DIAGNOSIS — Z8616 Personal history of COVID-19: Secondary | ICD-10-CM | POA: Diagnosis not present

## 2020-09-29 DIAGNOSIS — G44209 Tension-type headache, unspecified, not intractable: Secondary | ICD-10-CM | POA: Diagnosis not present

## 2020-09-29 DIAGNOSIS — R5383 Other fatigue: Secondary | ICD-10-CM | POA: Diagnosis not present

## 2020-11-14 DIAGNOSIS — M549 Dorsalgia, unspecified: Secondary | ICD-10-CM | POA: Diagnosis not present

## 2020-11-30 ENCOUNTER — Other Ambulatory Visit: Payer: Self-pay

## 2020-12-05 ENCOUNTER — Other Ambulatory Visit: Payer: Self-pay

## 2020-12-05 ENCOUNTER — Encounter: Payer: Self-pay | Admitting: Gastroenterology

## 2020-12-05 ENCOUNTER — Ambulatory Visit (INDEPENDENT_AMBULATORY_CARE_PROVIDER_SITE_OTHER): Payer: No Typology Code available for payment source | Admitting: Gastroenterology

## 2020-12-05 ENCOUNTER — Telehealth: Payer: Self-pay | Admitting: Gastroenterology

## 2020-12-05 VITALS — BP 131/87 | HR 105 | Temp 98.2°F | Ht 67.0 in | Wt 222.4 lb

## 2020-12-05 DIAGNOSIS — J3489 Other specified disorders of nose and nasal sinuses: Secondary | ICD-10-CM | POA: Insufficient documentation

## 2020-12-05 DIAGNOSIS — Z8719 Personal history of other diseases of the digestive system: Secondary | ICD-10-CM

## 2020-12-05 DIAGNOSIS — E669 Obesity, unspecified: Secondary | ICD-10-CM | POA: Insufficient documentation

## 2020-12-05 DIAGNOSIS — R319 Hematuria, unspecified: Secondary | ICD-10-CM | POA: Insufficient documentation

## 2020-12-05 DIAGNOSIS — Z0000 Encounter for general adult medical examination without abnormal findings: Secondary | ICD-10-CM | POA: Insufficient documentation

## 2020-12-05 DIAGNOSIS — Z8751 Personal history of pre-term labor: Secondary | ICD-10-CM | POA: Insufficient documentation

## 2020-12-05 DIAGNOSIS — M2350 Chronic instability of knee, unspecified knee: Secondary | ICD-10-CM | POA: Insufficient documentation

## 2020-12-05 DIAGNOSIS — O009 Unspecified ectopic pregnancy without intrauterine pregnancy: Secondary | ICD-10-CM | POA: Insufficient documentation

## 2020-12-05 DIAGNOSIS — K5090 Crohn's disease, unspecified, without complications: Secondary | ICD-10-CM | POA: Insufficient documentation

## 2020-12-05 DIAGNOSIS — R1031 Right lower quadrant pain: Secondary | ICD-10-CM | POA: Insufficient documentation

## 2020-12-05 DIAGNOSIS — R5383 Other fatigue: Secondary | ICD-10-CM | POA: Insufficient documentation

## 2020-12-05 DIAGNOSIS — O2690 Pregnancy related conditions, unspecified, unspecified trimester: Secondary | ICD-10-CM | POA: Insufficient documentation

## 2020-12-05 DIAGNOSIS — N879 Dysplasia of cervix uteri, unspecified: Secondary | ICD-10-CM | POA: Insufficient documentation

## 2020-12-05 DIAGNOSIS — F909 Attention-deficit hyperactivity disorder, unspecified type: Secondary | ICD-10-CM | POA: Insufficient documentation

## 2020-12-05 DIAGNOSIS — Z136 Encounter for screening for cardiovascular disorders: Secondary | ICD-10-CM | POA: Insufficient documentation

## 2020-12-05 DIAGNOSIS — M5430 Sciatica, unspecified side: Secondary | ICD-10-CM | POA: Insufficient documentation

## 2020-12-05 DIAGNOSIS — B079 Viral wart, unspecified: Secondary | ICD-10-CM | POA: Insufficient documentation

## 2020-12-05 DIAGNOSIS — J309 Allergic rhinitis, unspecified: Secondary | ICD-10-CM | POA: Insufficient documentation

## 2020-12-05 DIAGNOSIS — O4700 False labor before 37 completed weeks of gestation, unspecified trimester: Secondary | ICD-10-CM | POA: Insufficient documentation

## 2020-12-05 DIAGNOSIS — N83209 Unspecified ovarian cyst, unspecified side: Secondary | ICD-10-CM | POA: Insufficient documentation

## 2020-12-05 DIAGNOSIS — M25539 Pain in unspecified wrist: Secondary | ICD-10-CM | POA: Insufficient documentation

## 2020-12-05 DIAGNOSIS — M549 Dorsalgia, unspecified: Secondary | ICD-10-CM | POA: Insufficient documentation

## 2020-12-05 DIAGNOSIS — R55 Syncope and collapse: Secondary | ICD-10-CM | POA: Insufficient documentation

## 2020-12-05 DIAGNOSIS — IMO0002 Reserved for concepts with insufficient information to code with codable children: Secondary | ICD-10-CM | POA: Insufficient documentation

## 2020-12-05 DIAGNOSIS — M25549 Pain in joints of unspecified hand: Secondary | ICD-10-CM | POA: Insufficient documentation

## 2020-12-05 MED ORDER — NA SULFATE-K SULFATE-MG SULF 17.5-3.13-1.6 GM/177ML PO SOLN
354.0000 mL | Freq: Once | ORAL | 0 refills | Status: AC
  Filled 2020-12-05: qty 354, 1d supply, fill #0

## 2020-12-05 NOTE — Telephone Encounter (Signed)
Patient needs to reschedule her procedure. Please call °

## 2020-12-05 NOTE — Progress Notes (Signed)
Arlyss Repress, MD [address]  [address]  [place], [place] [address]  Main: [phone]  Fax: [phone]    Gastroenterology Consultation  Referring Provider:     Drucilla Chalet, MD Primary Care Physician:  [facility], [facility] Primary Gastroenterologist:  Dr. Arlyss Repress Reason for Consultation:     History of small bowel Crohn's        HPI:   Erin Hull is a 40 y.o. female referred by Dr. Tilford Pillar, [facility]  for consultation & management of history of small bowel Crohn's.  Patient is here to establish care for her small bowel Crohn's.  Patient reports that she is diagnosed with Crohn's disease of her small intestine based on video capsule endoscopy in [place] in 2015 after she had a colonoscopy.  She was on sulfasalazine for 2 years.  She reports that she had another colonoscopy in 2017.  Currently, patient is off therapy.  She does report pain in her right lower quadrant, alternating episodes of diarrhea and constipation.  She denies any rectal bleeding.  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  Upper endoscopy and colonoscopy in 2015 and colonoscopy in 2017 in [place]  Past Medical History:  Diagnosis Date  . Crohn disease (HCC)   . Ectopic pregnancy 05/2018    Past Surgical History:  Procedure Laterality Date  . DILATION AND EVACUATION N/A 06/07/2016   Procedure: DILATATION AND EVACUATION;  Surgeon: Christeen Douglas, MD;  Location: [facility] ORS;  Service: Gynecology;  Laterality: N/A;  . KNEE ARTHROSCOPY Left    x2  . LIPOMA EXCISION     back  . TONSILLECTOMY       Current Outpatient Medications:  .  acetaminophen (TYLENOL) 325 MG tablet, Take 650 mg by mouth every 6 (six) hours as needed., Disp: , Rfl:  .  amphetamine-dextroamphetamine (ADDERALL XR) 10 MG 24 hr capsule, Take 10 mg by mouth daily., Disp: , Rfl:  .  amphetamine-dextroamphetamine (ADDERALL) 10 MG tablet, Take 10 mg by mouth daily., Disp: , Rfl:  .  ibuprofen (ADVIL) 200 MG tablet, Take 200 mg by mouth every 6 (six) hours as needed., Disp: , Rfl:  .  Na Sulfate-K Sulfate-Mg Sulf 17.5-3.13-1.6 GM/177ML SOLN, Take 354 mLs by mouth once for 1 dose., Disp: 354 mL, Rfl: 0   Family History  Problem Relation Age of Onset  . Healthy Mother   . COPD Father   . Diabetes Father   . Heart disease Father   . Hypertension Father      Social History   Tobacco Use  . Smoking status: Never Smoker  . Smokeless tobacco: Never Used  Vaping Use  . Vaping Use: Never used  Substance Use Topics  . Alcohol use: No  . Drug use: No    Allergies as of 12/05/2020 - Review Complete 12/05/2020  Allergen Reaction Noted  . Lortab [hydrocodone-acetaminophen] Hives 04/14/2016  . Percocet [oxycodone-acetaminophen] Hives 04/14/2016  . Stellaria Other (See Comments) 12/05/2020    Review of Systems:    All systems reviewed and negative except where noted in HPI.   Physical Exam:  BP  131/87 (BP Location: Left Arm, Patient Position: Sitting, Cuff Size: Normal)   Pulse (!) 105   Temp 98.2 F (36.8 C) (Oral)   Ht 5\' 7"  (1.702 m)   Wt 222 lb 6 oz (100.9 kg)   BMI 34.83 kg/m  No LMP recorded.  General:   Alert,  Well-developed, well-nourished, pleasant and cooperative in NAD Head:  Normocephalic and atraumatic. Eyes:  Sclera clear, no icterus.   Conjunctiva pink. Ears:  Normal auditory acuity. Nose:  No deformity, discharge, or lesions. Mouth:  No deformity or lesions,oropharynx pink & moist. Neck:  Supple; no masses or thyromegaly. Lungs:  Respirations even and unlabored.  Clear throughout to  auscultation.   No wheezes, crackles, or rhonchi. No acute distress. Heart:  Regular rate and rhythm; no murmurs, clicks, rubs, or gallops. Abdomen:  Normal bowel sounds. Soft, non-tender and non-distended without masses, hepatosplenomegaly or hernias noted.  No guarding or rebound tenderness.   Rectal: Not performed Msk:  Symmetrical without gross deformities. Good, equal movement & strength bilaterally. Pulses:  Normal pulses noted. Extremities:  No clubbing or edema.  No cyanosis. Neurologic:  Alert and oriented x3;  grossly normal neurologically. Skin:  Intact without significant lesions or rashes. No jaundice. Psych:  Alert and cooperative. Normal mood and affect.  Imaging Studies: No abdominal imaging  Assessment and Plan:   Erin Hull is a 40 y.o. female with reported history of small bowel Crohn's based on video capsule endoscopy in 2015 in 2016, maintained on sulfasalazine until 2017 is here to establish care  Patient has alternating episodes of diarrhea and constipation along with lower abdominal pain Recommend colonoscopy with TI evaluation   Follow up in 3 to 4 months   2018, MD

## 2020-12-05 NOTE — Telephone Encounter (Signed)
Patient would like to moved procedure to 12/20/2020

## 2020-12-06 NOTE — Telephone Encounter (Signed)
Informed Erin Hull in Endo to change patient to 12/20/2020. She states she will get her move. Updated the referral

## 2020-12-09 ENCOUNTER — Other Ambulatory Visit: Payer: Self-pay

## 2020-12-09 MED ORDER — AMPHETAMINE-DEXTROAMPHET ER 10 MG PO CP24
ORAL_CAPSULE | ORAL | 0 refills | Status: DC
  Filled 2020-12-09: qty 30, 30d supply, fill #0

## 2020-12-20 ENCOUNTER — Encounter: Payer: Self-pay | Admitting: Gastroenterology

## 2020-12-20 ENCOUNTER — Ambulatory Visit: Payer: No Typology Code available for payment source | Admitting: Certified Registered Nurse Anesthetist

## 2020-12-20 ENCOUNTER — Ambulatory Visit
Admission: RE | Admit: 2020-12-20 | Discharge: 2020-12-20 | Disposition: A | Discharge status: Home / Self Care | Payer: No Typology Code available for payment source | Attending: Gastroenterology | Admitting: Gastroenterology

## 2020-12-20 ENCOUNTER — Encounter
Admission: RE | Disposition: A | Discharge status: Home / Self Care | Payer: Self-pay | Source: Home / Self Care | Attending: Gastroenterology

## 2020-12-20 ENCOUNTER — Other Ambulatory Visit: Payer: Self-pay

## 2020-12-20 DIAGNOSIS — Z888 Allergy status to other drugs, medicaments and biological substances status: Secondary | ICD-10-CM | POA: Diagnosis not present

## 2020-12-20 DIAGNOSIS — Z885 Allergy status to narcotic agent status: Secondary | ICD-10-CM | POA: Diagnosis not present

## 2020-12-20 DIAGNOSIS — Z79899 Other long term (current) drug therapy: Secondary | ICD-10-CM | POA: Diagnosis not present

## 2020-12-20 DIAGNOSIS — K633 Ulcer of intestine: Secondary | ICD-10-CM | POA: Diagnosis not present

## 2020-12-20 DIAGNOSIS — Z8249 Family history of ischemic heart disease and other diseases of the circulatory system: Secondary | ICD-10-CM | POA: Diagnosis not present

## 2020-12-20 DIAGNOSIS — K5000 Crohn's disease of small intestine without complications: Secondary | ICD-10-CM | POA: Diagnosis present

## 2020-12-20 DIAGNOSIS — Z833 Family history of diabetes mellitus: Secondary | ICD-10-CM | POA: Diagnosis not present

## 2020-12-20 DIAGNOSIS — K529 Noninfective gastroenteritis and colitis, unspecified: Secondary | ICD-10-CM | POA: Diagnosis not present

## 2020-12-20 DIAGNOSIS — Z8719 Personal history of other diseases of the digestive system: Secondary | ICD-10-CM

## 2020-12-20 HISTORY — DX: Other complications of anesthesia, initial encounter: T88.59XA

## 2020-12-20 HISTORY — PX: COLONOSCOPY WITH PROPOFOL: SHX5780

## 2020-12-20 HISTORY — DX: Nausea with vomiting, unspecified: R11.2

## 2020-12-20 HISTORY — DX: Other specified postprocedural states: Z98.890

## 2020-12-20 LAB — POCT PREGNANCY, URINE: Preg Test, Ur: NEGATIVE

## 2020-12-20 SURGERY — COLONOSCOPY WITH PROPOFOL
Anesthesia: General

## 2020-12-20 MED ORDER — ONDANSETRON HCL 4 MG/2ML IJ SOLN
INTRAMUSCULAR | Status: AC
  Filled 2020-12-20: qty 2

## 2020-12-20 MED ORDER — PROPOFOL 500 MG/50ML IV EMUL
INTRAVENOUS | Status: DC | PRN
  Administered 2020-12-20: 140 ug/kg/min via INTRAVENOUS

## 2020-12-20 MED ORDER — PROPOFOL 500 MG/50ML IV EMUL
INTRAVENOUS | Status: AC
  Filled 2020-12-20: qty 250

## 2020-12-20 MED ORDER — ONDANSETRON HCL 4 MG/2ML IJ SOLN
4.0000 mg | Freq: Once | INTRAMUSCULAR | Status: AC | PRN
  Administered 2020-12-20: 4 mg via INTRAVENOUS

## 2020-12-20 MED ORDER — PROPOFOL 10 MG/ML IV BOLUS
INTRAVENOUS | Status: AC
  Filled 2020-12-20: qty 20

## 2020-12-20 MED ORDER — SODIUM CHLORIDE 0.9 % IV SOLN: INTRAVENOUS | Status: DC

## 2020-12-20 MED ORDER — FENTANYL CITRATE (PF) 100 MCG/2ML IJ SOLN: 25.0000 ug | INTRAMUSCULAR | Status: DC | PRN

## 2020-12-20 MED ORDER — LIDOCAINE HCL (CARDIAC) PF 100 MG/5ML IV SOSY
PREFILLED_SYRINGE | INTRAVENOUS | Status: DC | PRN
  Administered 2020-12-20: 60 mg via INTRAVENOUS

## 2020-12-20 MED ORDER — ONDANSETRON HCL 4 MG/2ML IJ SOLN: 4.0000 mg | Freq: Once | INTRAMUSCULAR | Status: DC

## 2020-12-20 MED ORDER — PROPOFOL 10 MG/ML IV BOLUS
INTRAVENOUS | Status: DC | PRN
  Administered 2020-12-20: 30 mg via INTRAVENOUS
  Administered 2020-12-20: 10 mg via INTRAVENOUS
  Administered 2020-12-20: 60 mg via INTRAVENOUS

## 2020-12-20 NOTE — Anesthesia Preprocedure Evaluation (Signed)
Anesthesia Evaluation  Patient identified by MRN, date of birth, ID band Patient awake    Reviewed: Allergy & Precautions, NPO status , Patient's Chart, lab work & pertinent test results  History of Anesthesia Complications (+) PONV and history of anesthetic complications  Airway Mallampati: II  TM Distance: >3 FB     Dental  (+) Teeth Intact   Pulmonary neg pulmonary ROS,    Pulmonary exam normal        Cardiovascular negative cardio ROS Normal cardiovascular exam     Neuro/Psych  Headaches,  Neuromuscular disease negative psych ROS   GI/Hepatic Neg liver ROS, Crohns disease   Endo/Other  negative endocrine ROS  Renal/GU negative Renal ROS  Female GU complaint     Musculoskeletal negative musculoskeletal ROS (+)   Abdominal Normal abdominal exam  (+)   Peds negative pediatric ROS (+)  Hematology negative hematology ROS (+)   Anesthesia Other Findings Past Medical History: No date: Complication of anesthesia No date: Crohn disease (HCC) 05/2018: Ectopic pregnancy No date: PONV (postoperative nausea and vomiting)  Reproductive/Obstetrics                             Anesthesia Physical Anesthesia Plan  ASA: II  Anesthesia Plan: General   Post-op Pain Management:    Induction: Intravenous  PONV Risk Score and Plan: Propofol infusion and TIVA  Airway Management Planned: Nasal Cannula  Additional Equipment:   Intra-op Plan:   Post-operative Plan:   Informed Consent: I have reviewed the patients History and Physical, chart, labs and discussed the procedure including the risks, benefits and alternatives for the proposed anesthesia with the patient or authorized representative who has indicated his/her understanding and acceptance.     Dental advisory given  Plan Discussed with: CRNA  Anesthesia Plan Comments:         Anesthesia Quick Evaluation

## 2020-12-20 NOTE — Anesthesia Postprocedure Evaluation (Signed)
Anesthesia Post Note  Patient: Erin Hull  Procedure(s) Performed: COLONOSCOPY WITH PROPOFOL (N/A )  Patient location during evaluation: Endoscopy Anesthesia Type: General Level of consciousness: awake and alert and oriented Pain management: pain level controlled Vital Signs Assessment: post-procedure vital signs reviewed and stable Respiratory status: spontaneous breathing Cardiovascular status: blood pressure returned to baseline Anesthetic complications: no   No complications documented.   Last Vitals:  Vitals:   12/20/20 0857 12/20/20 0907  BP: 107/77 103/71  Pulse:    Resp:    Temp:    SpO2:      Last Pain:  Vitals:   12/20/20 0907  TempSrc:   PainSc: 0-No pain                 Tyrece Vanterpool

## 2020-12-20 NOTE — Anesthesia Procedure Notes (Signed)
Date/Time: 12/20/2020 8:15 AM Performed by: Joanette Gula, Barnell Shieh, CRNA Pre-anesthesia Checklist: Patient identified, Emergency Drugs available, Suction available, Patient being monitored and Timeout performed Patient Re-evaluated:Patient Re-evaluated prior to induction Oxygen Delivery Method: Nasal cannula Induction Type: IV induction

## 2020-12-20 NOTE — Op Note (Signed)
[facility] Gastroenterology Patient Name: Erin Hull Procedure Date: 12/20/2020 7:22 AM MRN: [id number] Account #: [id number] Date of Birth: [birth year] Admit Type: Outpatient Age: 40 Room: [facility] ENDO ROOM 3 Gender: Female Note Status: Finalized Procedure:             Colonoscopy Indications:           For therapy of Crohn's disease of the small bowel,                         Disease activity assessment of Crohn's disease of the                         small bowel, Assess therapeutic response to therapy of                         Crohn's disease of the small bowel Providers:             Toney Reil MD, MD Referring MD:          [facility] (Referring MD) Medicines:             General Anesthesia Complications:         No immediate complications. Estimated blood loss: None. Procedure:             Pre-Anesthesia Assessment:                        - Prior to the procedure, a History and Physical was                         performed, and patient medications and allergies were                         reviewed. The patient is competent. The risks and                         benefits of the procedure and the sedation options and                         risks were discussed with the patient. All questions                         were answered and informed consent was obtained.                         Patient identification and proposed procedure were                         verified by the physician, the nurse, the                         anesthesiologist, the anesthetist and the technician                         in the pre-procedure area in the procedure room in the                         endoscopy suite. Mental Status Examination: alert and  oriented. Airway Examination: normal oropharyngeal                         airway and neck mobility. Respiratory Examination:                         clear to auscultation. CV Examination:  normal.                         Prophylactic Antibiotics: The patient does not require                         prophylactic antibiotics. Prior Anticoagulants: The                         patient has taken no previous anticoagulant or                         antiplatelet agents. ASA Grade Assessment: II - A                         patient with mild systemic disease. After reviewing                         the risks and benefits, the patient was deemed in                         satisfactory condition to undergo the procedure. The                         anesthesia plan was to use general anesthesia.                         Immediately prior to administration of medications,                         the patient was re-assessed for adequacy to receive                         sedatives. The heart rate, respiratory rate, oxygen                         saturations, blood pressure, adequacy of pulmonary                         ventilation, and response to care were monitored                         throughout the procedure. The physical status of the                         patient was re-assessed after the procedure.                        After obtaining informed consent, the colonoscope was                         passed under direct vision. Throughout the procedure,  the patient's blood pressure, pulse, and oxygen                         saturations were monitored continuously. The                         Colonoscope was introduced through the anus and                         advanced to the the terminal ileum, with                         identification of the appendiceal orifice and IC                         valve. The colonoscopy was performed without                         difficulty. The patient tolerated the procedure well.                         The quality of the bowel preparation was evaluated                         using the BBPS [facility] Bowel Preparation  Scale) with                         scores of: Right Colon = 3, Transverse Colon = 3 and                         Left Colon = 3 (entire mucosa seen well with no                         residual staining, small fragments of stool or opaque                         liquid). The total BBPS score equals 9. Findings:      The perianal and digital rectal examinations were normal. Pertinent       negatives include normal sphincter tone and no palpable rectal lesions.      The terminal ileum contained multiple scattered non-bleeding erosions       and aphthae likely from underlying small bowel crohn's history. No       stigmata of recent bleeding were seen. Biopsies were taken with a cold       forceps for histology. Estimated blood loss: none.      Normal mucosa was found in the left colon and in the right colon.       Biopsies were taken with a cold forceps for histology.      The retroflexed view of the distal rectum and anal verge was normal and       showed no anal or rectal abnormalities. Impression:            - Multiple erosions in the terminal ileum. Biopsied.                        - Normal mucosa in the left colon and in the right  colon. Biopsied.                        - The distal rectum and anal verge are normal on                         retroflexion view. Recommendation:        - Discharge patient to home (with escort).                        - Resume previous diet today.                        - Continue present medications.                        - Await pathology results.                        - Return to my office as previously scheduled. Procedure Code(s):     --- Professional ---                        [phone], Colonoscopy, flexible; with biopsy, single or                         multiple Diagnosis Code(s):     --- Professional ---                        K63.3, Ulcer of intestine                        K50.00, Crohn's disease of small intestine  without                         complications CPT copyright 2019 American Medical Association. All rights reserved. The codes documented in this report are preliminary and upon coder review may  be revised to meet current compliance requirements. Dr. Libby Maw Toney Reil MD, MD 12/20/2020 8:36:40 AM This report has been signed electronically. Number of Addenda: 0 Note Initiated On: 12/20/2020 7:22 AM Scope Withdrawal Time: 0 hours 9 minutes 2 seconds  Total Procedure Duration: 0 hours 13 minutes 9 seconds  Estimated Blood Loss:  Estimated blood loss: none.      [facility]

## 2020-12-20 NOTE — Transfer of Care (Signed)
Immediate Anesthesia Transfer of Care Note  Patient: Erin Hull  Procedure(s) Performed: COLONOSCOPY WITH PROPOFOL (N/A )  Patient Location: Endoscopy Unit  Anesthesia Type:General  Level of Consciousness: awake and patient cooperative  Airway & Oxygen Therapy: Patient Spontanous Breathing and Patient connected to face mask oxygen  Post-op Assessment: Report given to RN and Post -op Vital signs reviewed and stable  Post vital signs: Reviewed and stable  Last Vitals:  Vitals Value Taken Time  BP 100/63 12/20/20 0837  Temp 36.1 C 12/20/20 0837  Pulse 96 12/20/20 0838  Resp 21 12/20/20 0838  SpO2 100 % 12/20/20 0838  Vitals shown include unvalidated device data.  Last Pain:  Vitals:   12/20/20 0837  TempSrc: Temporal  PainSc: Asleep         Complications: No complications documented.

## 2020-12-20 NOTE — H&P (Signed)
Arlyss Repress, MD [address]  [address]  [place], [place] [address]  Main: [phone]  Fax: [phone] Pager: [phone]  Primary Care Physician:  [facility], Georges Lynch Primary Gastroenterologist:  Dr. Arlyss Repress  Pre-Procedure History & Physical: HPI:  ROSHONDA Erin Hull is a 40 y.o. female is here for an colonoscopy.   Past Medical History:  Diagnosis Date  . Complication of anesthesia   . Crohn disease (HCC)   . Ectopic pregnancy 05/2018  . PONV (postoperative nausea and vomiting)     Past Surgical History:  Procedure Laterality Date  . DILATION AND EVACUATION N/A 06/07/2016   Procedure: DILATATION AND EVACUATION;  Surgeon: Christeen Douglas, MD;  Location: [facility] ORS;  Service: Gynecology;  Laterality: N/A;  . KNEE ARTHROSCOPY Left    x2  . LIPOMA EXCISION     back  . TONSILLECTOMY      Prior to Admission medications   Medication Sig Start Date End Date Taking? Authorizing Provider  amphetamine-dextroamphetamine (ADDERALL XR) 10 MG 24 hr capsule Take 10 mg by mouth daily.   Yes [provider]  amphetamine-dextroamphetamine (ADDERALL) 10 MG tablet Take 10 mg by mouth daily. 12/02/20  Yes [provider]  acetaminophen (TYLENOL) 325 MG tablet Take 650 mg by mouth every 6 (six) hours as needed.    [provider]  amphetamine-dextroamphetamine (ADDERALL XR) 10 MG 24 hr capsule 1 PO QAM 12/09/20     ibuprofen (ADVIL) 200 MG tablet Take 200 mg by mouth every 6 (six) hours as needed.    [provider]    Allergies as of 12/06/2020 - Review Complete 12/05/2020  Allergen Reaction Noted  . Lortab [hydrocodone-acetaminophen] Hives 04/14/2016  . Percocet [oxycodone-acetaminophen] Hives 04/14/2016  . Stellaria Other (See Comments) 12/05/2020    Family History  Problem Relation Age of Onset  . Healthy Mother   . COPD Father   . Diabetes Father   . Heart disease Father   . Hypertension Father     Social History    Socioeconomic History  . Marital status: Legally Separated    Spouse name: Not on file  . Number of children: Not on file  . Years of education: Not on file  . Highest education level: Not on file  Occupational History  . Not on file  Tobacco Use  . Smoking status: Never Smoker  . Smokeless tobacco: Never Used  Vaping Use  . Vaping Use: Never used  Substance and Sexual Activity  . Alcohol use: No  . Drug use: No  . Sexual activity: Not on file  Other Topics Concern  . Not on file  Social History Narrative  . Not on file   Social Determinants of Health   Financial Resource Strain: Not on file  Food Insecurity: Not on file  Transportation Needs: Not on file  Physical Activity: Not on file  Stress: Not on file  Social Connections: Not on file  Intimate Partner Violence: Not on file    Review of Systems: See HPI, otherwise negative ROS  Physical Exam: BP 121/81   Pulse 94   Temp 98.1 F (36.7 C) (Temporal)   Resp 18   Ht 5\' 7"  (1.702 m)   Wt 95.3 kg   SpO2 97%   BMI 32.89 kg/m  General:   Alert,  pleasant and cooperative in NAD Head:  Normocephalic and atraumatic. Neck:  Supple; no masses or thyromegaly. Lungs:  Clear throughout to auscultation.    Heart:  Regular  rate and rhythm. Abdomen:  Soft, nontender and nondistended. Normal bowel sounds, without guarding, and without rebound.   Neurologic:  Alert and  oriented x4;  grossly normal neurologically.  Impression/Plan: Erin Hull is here for an colonoscopy to be performed for h/o small bowel crohn's  Risks, benefits, limitations, and alternatives regarding  colonoscopy have been reviewed with the patient.  Questions have been answered.  All parties agreeable.   Lannette Donath, MD  12/20/2020, 8:09 AM

## 2020-12-21 ENCOUNTER — Encounter: Payer: Self-pay | Admitting: Gastroenterology

## 2020-12-21 LAB — SURGICAL PATHOLOGY

## 2020-12-22 ENCOUNTER — Telehealth: Payer: Self-pay

## 2020-12-22 ENCOUNTER — Other Ambulatory Visit: Payer: Self-pay

## 2020-12-22 DIAGNOSIS — K50019 Crohn's disease of small intestine with unspecified complications: Secondary | ICD-10-CM

## 2020-12-22 MED ORDER — BUDESONIDE ER 9 MG PO TB24
9.0000 mg | ORAL_TABLET | Freq: Every day | ORAL | 0 refills | Status: DC
  Filled 2020-12-22: qty 30, fill #0

## 2020-12-22 MED ORDER — BUDESONIDE 3 MG PO CPEP
9.0000 mg | ORAL_CAPSULE | Freq: Every day | ORAL | 0 refills | Status: DC
  Filled 2020-12-22: qty 90, 30d supply, fill #0

## 2020-12-22 MED ORDER — MESALAMINE ER 0.375 G PO CP24
750.0000 mg | ORAL_CAPSULE | Freq: Two times a day (BID) | ORAL | 1 refills | Status: DC
  Filled 2020-12-22: qty 360, 90d supply, fill #0

## 2020-12-22 NOTE — Telephone Encounter (Signed)
-----   Message from Toney Reil, MD sent at 12/21/2020  4:13 PM EDT ----- Pathology results showed small bowel Crohn's.  Recommend budesonide 3 mg 3 pills daily for 1 month and Apriso 0.375 mg 2 pills 2 times daily, 90 days and 1 refill  Please order fecal calprotectin levels  RV

## 2020-12-22 NOTE — Telephone Encounter (Signed)
Called in medications to the pharmacy. Order the lab test. Patient read mychart message

## 2020-12-23 ENCOUNTER — Other Ambulatory Visit: Payer: Self-pay

## 2020-12-28 ENCOUNTER — Telehealth: Payer: Self-pay | Admitting: Gastroenterology

## 2020-12-28 ENCOUNTER — Other Ambulatory Visit: Payer: Self-pay

## 2020-12-28 NOTE — Telephone Encounter (Signed)
Patient LVM to please fax her RX to [phone] and to use her  social security number as a reference [phone]. Patient also said to call her if you need to.

## 2020-12-29 MED ORDER — MESALAMINE ER 0.375 G PO CP24: 750.0000 mg | ORAL_CAPSULE | Freq: Two times a day (BID) | ORAL | 1 refills | Status: DC

## 2020-12-29 NOTE — Telephone Encounter (Signed)
Last office visit 12/05/2020 history of crohns disease  Last refill 12/22/2020 360 1 refills  Patient wants it sent to [facility] pharmacy

## 2021-01-04 ENCOUNTER — Other Ambulatory Visit: Payer: Self-pay

## 2021-01-04 MED ORDER — AMPHETAMINE-DEXTROAMPHETAMINE 10 MG PO TABS
ORAL_TABLET | ORAL | 0 refills | Status: DC
  Filled 2021-01-04 – 2021-01-30 (×2): qty 30, 30d supply, fill #0

## 2021-01-04 MED ORDER — AMPHETAMINE-DEXTROAMPHET ER 10 MG PO CP24
10.0000 mg | ORAL_CAPSULE | Freq: Every morning | ORAL | 0 refills | Status: DC
  Filled 2021-01-04 – 2021-01-30 (×2): qty 30, 30d supply, fill #0

## 2021-01-16 ENCOUNTER — Other Ambulatory Visit: Payer: Self-pay

## 2021-01-30 ENCOUNTER — Other Ambulatory Visit: Payer: Self-pay

## 2021-01-30 ENCOUNTER — Encounter: Payer: Self-pay | Admitting: Gastroenterology

## 2021-01-30 ENCOUNTER — Other Ambulatory Visit: Payer: Self-pay | Admitting: Gastroenterology

## 2021-01-30 DIAGNOSIS — K5000 Crohn's disease of small intestine without complications: Secondary | ICD-10-CM

## 2021-01-30 DIAGNOSIS — M7061 Trochanteric bursitis, right hip: Secondary | ICD-10-CM | POA: Insufficient documentation

## 2021-01-30 MED ORDER — PREDNISONE 10 MG PO TABS: ORAL_TABLET | ORAL | 0 refills | Status: DC

## 2021-01-31 ENCOUNTER — Telehealth: Payer: Self-pay | Admitting: Gastroenterology

## 2021-01-31 ENCOUNTER — Other Ambulatory Visit: Payer: Self-pay

## 2021-01-31 DIAGNOSIS — K50019 Crohn's disease of small intestine with unspecified complications: Secondary | ICD-10-CM | POA: Diagnosis not present

## 2021-01-31 DIAGNOSIS — K5000 Crohn's disease of small intestine without complications: Secondary | ICD-10-CM

## 2021-01-31 MED ORDER — PREDNISONE 10 MG PO TABS
ORAL_TABLET | ORAL | 0 refills | Status: AC
  Filled 2021-01-31: qty 100, 35d supply, fill #0

## 2021-01-31 NOTE — Telephone Encounter (Signed)
Please call into [facility] pharmacy stat  predniSONE (DELTASONE) 10 MG tablet Medication Date: 01/30/2021 Department: [facility] Ordering/Authorizing: Toney Reil, MD    Order Providers  Prescribing Provider Encounter Provider  Vanga, Loel Dubonnet, MD Toney Reil, MD   Outpatient Medication Detail   Disp Refills Start End   predniSONE (DELTASONE) 10 MG tablet 100 tablet 0 01/30/2021 03/06/2021   Sig - Route: Take 4 tablets (40 mg total) by mouth daily with breakfast for 14 days, THEN 3 tablets (30 mg total) daily with breakfast for 7 days, THEN 2 tablets (20 mg total) daily with breakfast for 7 days, THEN 1 tablet (10 mg total) daily with breakfast for 7 days. - Oral   Sent to pharmacy as: predniSONE (DELTASONE) 10 MG tablet   E-Prescribing Status: Receipt confirmed by pharmacy (01/30/2021  9:51 AM EDT)    Associated Diagnoses  Crohn's disease of small intestine without complication [facility])  - Primary      Pharmacy  [id number] [place],  - [address]   Additional Information  Associated Reports  View Encounter  Priority and Order Details

## 2021-01-31 NOTE — Telephone Encounter (Signed)
She states that prednisone was sent to the wrong pharmacy. She wants it sent [facility] Resent to [facility]

## 2021-02-01 ENCOUNTER — Other Ambulatory Visit: Payer: Self-pay | Admitting: Gastroenterology

## 2021-02-02 ENCOUNTER — Encounter: Payer: Self-pay | Admitting: Gastroenterology

## 2021-02-03 ENCOUNTER — Encounter: Payer: Self-pay | Admitting: Gastroenterology

## 2021-02-03 LAB — CALPROTECTIN, FECAL: Calprotectin, Fecal: 19 ug/g (ref 0–120)

## 2021-03-14 DIAGNOSIS — E162 Hypoglycemia, unspecified: Secondary | ICD-10-CM | POA: Diagnosis not present

## 2021-03-14 DIAGNOSIS — H6501 Acute serous otitis media, right ear: Secondary | ICD-10-CM | POA: Diagnosis not present

## 2021-03-14 DIAGNOSIS — K5090 Crohn's disease, unspecified, without complications: Secondary | ICD-10-CM | POA: Diagnosis not present

## 2021-03-14 DIAGNOSIS — Z23 Encounter for immunization: Secondary | ICD-10-CM | POA: Diagnosis not present

## 2021-04-03 ENCOUNTER — Telehealth: Payer: 59 | Admitting: Gastroenterology

## 2021-04-14 DIAGNOSIS — Z1231 Encounter for screening mammogram for malignant neoplasm of breast: Secondary | ICD-10-CM | POA: Diagnosis not present

## 2021-04-25 ENCOUNTER — Other Ambulatory Visit: Payer: Self-pay

## 2021-04-25 MED ORDER — EMGALITY 120 MG/ML ~~LOC~~ SOAJ
SUBCUTANEOUS | 0 refills | Status: DC
  Filled 2021-04-25: qty 2, 28d supply, fill #0

## 2021-04-25 MED ORDER — RIZATRIPTAN BENZOATE 10 MG PO TABS
ORAL_TABLET | ORAL | 0 refills | Status: DC
  Filled 2021-04-25: qty 6, 10d supply, fill #0

## 2021-04-25 MED ORDER — EMGALITY 120 MG/ML ~~LOC~~ SOAJ: SUBCUTANEOUS | 11 refills | Status: DC

## 2021-04-26 DIAGNOSIS — Z1330 Encounter for screening examination for mental health and behavioral disorders, unspecified: Secondary | ICD-10-CM | POA: Diagnosis not present

## 2021-05-13 ENCOUNTER — Encounter: Payer: Self-pay | Admitting: Emergency Medicine

## 2021-05-13 ENCOUNTER — Other Ambulatory Visit: Payer: Self-pay

## 2021-05-13 ENCOUNTER — Ambulatory Visit
Admission: EM | Admit: 2021-05-13 | Discharge: 2021-05-13 | Disposition: A | Discharge status: Home / Self Care | Payer: 59 | Attending: Emergency Medicine | Admitting: Emergency Medicine

## 2021-05-13 DIAGNOSIS — B029 Zoster without complications: Secondary | ICD-10-CM | POA: Diagnosis not present

## 2021-05-13 MED ORDER — VALACYCLOVIR HCL 1 G PO TABS: 1000.0000 mg | ORAL_TABLET | Freq: Three times a day (TID) | ORAL | 0 refills | Status: AC

## 2021-05-13 NOTE — ED Triage Notes (Signed)
Noticed on bump under right breast yesterday.  Right breast has been tender for 2 days, this morning had more bumps under right breast, and pain in right back.  Bumps are painful and do itch

## 2021-05-13 NOTE — ED Provider Notes (Signed)
Chief Complaint   Chief Complaint  Patient presents with   Rash     Subjective, HPI  Erin Hull is a very pleasant 40 y.o. female who presents with bumps beneath right breast that started yesterday.  Patient reports tenderness to the area for about 2 days.  She reports noticing a couple of more bumps under the right breast and pain to the right back.  Patient reports pain and itching.  History obtained from patient.   Patient's problem list, past medical and social history, medications, and allergies were reviewed by me and updated in Epic.    ROS  See HPI.  Objective   Vitals:   05/13/21 1510  BP: 126/86  Pulse: 89  Resp: 20  Temp: 98.6 F (37 C)  SpO2: 97%    Vital signs and nursing note reviewed.  General: Appears well-developed and well-nourished. No acute distress.  HEENT: Normocephalic, atraumatic, hearing grossly intact. EOMI, no drainage. No rhinorrhea. Moist mucous membranes.  Neck: Normal range of motion, neck is supple.  Cardiovascular: Normal rate.  Pulm/Chest: No respiratory distress.   Musculoskeletal: No joint deformity, normal range of motion.  Skin: Erythematous excoriated rash noted beneath right breast.  Data  No results found for any visits on 05/13/21.    Assessment & Plan  1. Herpes zoster without complication - valACYclovir (VALTREX) 1000 MG tablet; Take 1 tablet (1,000 mg total) by mouth 3 (three) times daily for 7 days.  Dispense: 21 tablet; Refill: 0  41 y.o. female presents with bumps beneath right breast that started yesterday.  Patient reports tenderness to the area for about 2 days.  She reports noticing a couple of more bumps under the right breast and pain to the right back.  Patient reports pain and itching.  Given symptoms along with assessment findings, concern for shingles.  Rx'd valacyclovir to the patient's preferred pharmacy and advised about home treatment and care as outlined in her AVS to include returning with any fever,  chills, worsening pain.  Also advised use of cool compresses to the area to help with discomfort along with Tylenol or ibuprofen as needed.  Advised that when she does return to work she will need to cover the areas with a large Band-Aid.  Patient verbalized understanding and agreed with plan.  Patient stable upon discharge.  Return as needed.  Plan:   Discharge Instructions      Take valacyclovir as prescribed.  Will compresses to the area to help with discomfort.  You may use Tylenol or ibuprofen as needed for discomfort.  When you return to work you will need to cover the areas with a large Band-Aid as described in clinic today.  If you begin to have any fever, chills, worsening pain you will need to return to clinic for reevaluation.         Amalia Greenhouse, [place] 05/13/21 [phone]

## 2021-05-13 NOTE — Discharge Instructions (Addendum)
Take valacyclovir as prescribed.  Will compresses to the area to help with discomfort.  You may use Tylenol or ibuprofen as needed for discomfort.  When you return to work you will need to cover the areas with a large Band-Aid as described in clinic today.  If you begin to have any fever, chills, worsening pain you will need to return to clinic for reevaluation.

## 2021-05-22 ENCOUNTER — Other Ambulatory Visit: Payer: Self-pay | Admitting: Neurology

## 2021-05-22 DIAGNOSIS — G43119 Migraine with aura, intractable, without status migrainosus: Secondary | ICD-10-CM

## 2021-05-22 DIAGNOSIS — H9313 Tinnitus, bilateral: Secondary | ICD-10-CM

## 2021-06-07 ENCOUNTER — Ambulatory Visit
Admission: RE | Admit: 2021-06-07 | Discharge: 2021-06-07 | Disposition: A | Discharge status: Home / Self Care | Payer: No Typology Code available for payment source | Source: Ambulatory Visit | Attending: Neurology | Admitting: Neurology

## 2021-06-07 DIAGNOSIS — H9313 Tinnitus, bilateral: Secondary | ICD-10-CM | POA: Insufficient documentation

## 2021-06-07 DIAGNOSIS — G43119 Migraine with aura, intractable, without status migrainosus: Secondary | ICD-10-CM | POA: Diagnosis present

## 2021-06-07 DIAGNOSIS — G43909 Migraine, unspecified, not intractable, without status migrainosus: Secondary | ICD-10-CM | POA: Diagnosis not present

## 2021-06-07 DIAGNOSIS — G43919 Migraine, unspecified, intractable, without status migrainosus: Secondary | ICD-10-CM | POA: Diagnosis not present

## 2021-06-07 MED ORDER — GADOBUTROL 1 MMOL/ML IV SOLN
10.0000 mL | Freq: Once | INTRAVENOUS | Status: AC | PRN
  Administered 2021-06-07: 10 mL via INTRAVENOUS

## 2021-06-14 DIAGNOSIS — M9904 Segmental and somatic dysfunction of sacral region: Secondary | ICD-10-CM | POA: Diagnosis not present

## 2021-06-14 DIAGNOSIS — M9903 Segmental and somatic dysfunction of lumbar region: Secondary | ICD-10-CM | POA: Diagnosis not present

## 2021-06-14 DIAGNOSIS — M5431 Sciatica, right side: Secondary | ICD-10-CM | POA: Diagnosis not present

## 2021-06-14 DIAGNOSIS — M722 Plantar fascial fibromatosis: Secondary | ICD-10-CM | POA: Diagnosis not present

## 2021-06-19 ENCOUNTER — Emergency Department
Admission: EM | Admit: 2021-06-19 | Discharge: 2021-06-19 | Disposition: A | Discharge status: Home / Self Care | Payer: No Typology Code available for payment source | Attending: Emergency Medicine | Admitting: Emergency Medicine

## 2021-06-19 ENCOUNTER — Other Ambulatory Visit: Payer: Self-pay

## 2021-06-19 DIAGNOSIS — M546 Pain in thoracic spine: Secondary | ICD-10-CM | POA: Diagnosis present

## 2021-06-19 DIAGNOSIS — M5412 Radiculopathy, cervical region: Secondary | ICD-10-CM | POA: Insufficient documentation

## 2021-06-19 MED ORDER — OXYCODONE HCL 5 MG PO TABS
5.0000 mg | ORAL_TABLET | Freq: Once | ORAL | Status: DC
  Filled 2021-06-19: qty 1

## 2021-06-19 MED ORDER — NAPROXEN 250 MG PO TABS: 250.0000 mg | ORAL_TABLET | Freq: Two times a day (BID) | ORAL | 0 refills | Status: AC

## 2021-06-19 MED ORDER — KETOROLAC TROMETHAMINE 30 MG/ML IJ SOLN
15.0000 mg | Freq: Once | INTRAMUSCULAR | Status: AC
  Administered 2021-06-19: 15 mg via INTRAVENOUS
  Filled 2021-06-19: qty 1

## 2021-06-19 NOTE — ED Provider Notes (Addendum)
[facility]  ____________________________________________   Event Date/Time   First MD Initiated Contact with Patient 06/19/21 1250     (approximate)  I have reviewed the triage vital signs and the nursing notes.   HISTORY  Chief Complaint Back Pain    HPI Erin Hull is a 40 y.o. female pmh of migraines, croohns disease and chronic back pain presenting with back pain.  Pain is located in the mid thoracic region as well as along the left trap region radiating around to her scapula.  Patient does endorse some decreased sensation and weakness in the left arm.  In the past she has had chronic back pain which she describes as the thoracic location however has never had the arm symptoms in the past.  She denies any inciting injury.  Denies fevers chills.  Denies any numbness or weakness in her lower extremities difficulty with bowel or bladder incontinenceshe has been taking Tylenol and ibuprofen. She is not on any immunosuppression or chronic steroids for her Crohn's disease.         Past Medical History:  Diagnosis Date   Complication of anesthesia    Crohn disease (HCC)    Ectopic pregnancy 05/2018   PONV (postoperative nausea and vomiting)     Patient Active Problem List   Diagnosis Date Noted   Trochanteric bursitis, right hip 01/30/2021   History of Crohn's disease    Obesity with body mass index 30 or greater 12/05/2020   Crohn's disease (HCC) 12/05/2020   Abdominal pain, RLQ (right lower quadrant) 12/05/2020   Adult attention deficit hyperactivity disorder 12/05/2020   Allergic rhinitis 12/05/2020   Attention-deficit hyperactivity disorder 12/05/2020   Cervical dysplasia 12/05/2020   Backache 12/05/2020   Ectopic pregnancy 12/05/2020   Dyspareunia 12/05/2020   Essential hematuria 12/05/2020   Fatigue 12/05/2020   Old posterior cruciate ligament disruption 12/05/2020   Complication of pregnancy, antepartum 12/05/2020   Threatened  premature labor 12/05/2020   Tear of medial cartilage or meniscus of knee, current 12/05/2020   Syncope and collapse 12/05/2020   Sprain and strain of hip and thigh 12/05/2020   Screening for other and unspecified cardiovascular conditions 12/05/2020   Viral warts 12/05/2020   Sciatica 12/05/2020   Personal history of pre-term labor 12/05/2020   Pain in joint, hand 12/05/2020   Pain in joint, forearm 12/05/2020   Ovarian cyst 12/05/2020   Other diseases of nasal cavity and sinuses 12/05/2020   Intractable migraine with aura without status migrainosus 05/10/2016    Past Surgical History:  Procedure Laterality Date   COLONOSCOPY WITH PROPOFOL N/A 12/20/2020   Procedure: COLONOSCOPY WITH PROPOFOL;  Surgeon: Toney Reil, MD;  Location: [facility] ENDOSCOPY;  Service: Gastroenterology;  Laterality: N/A;   DILATION AND EVACUATION N/A 06/07/2016   Procedure: DILATATION AND EVACUATION;  Surgeon: Christeen Douglas, MD;  Location: [facility] ORS;  Service: Gynecology;  Laterality: N/A;   KNEE ARTHROSCOPY Left    x2   LIPOMA EXCISION     back   TONSILLECTOMY      Prior to Admission medications   Medication Sig Start Date End Date Taking? Authorizing Provider  naproxen (NAPROSYN) 250 MG tablet Take 1 tablet (250 mg total) by mouth 2 (two) times daily with a meal. 06/19/21 07/19/21 Yes Georga Hacking, MD  acetaminophen (TYLENOL) 325 MG tablet Take 650 mg by mouth every 6 (six) hours as needed.    [provider]  amphetamine-dextroamphetamine (ADDERALL XR) 10 MG 24 hr capsule Take  10 mg by mouth daily.    [provider]  amphetamine-dextroamphetamine (ADDERALL XR) 10 MG 24 hr capsule Take 1 capsule (10 mg total) by mouth every morning. 01/03/21     amphetamine-dextroamphetamine (ADDERALL) 10 MG tablet Take 10 mg by mouth daily. 12/02/20   [provider]  amphetamine-dextroamphetamine (ADDERALL) 10 MG tablet Take 1 tablet by mouth daily as needed in afternoon for  concentration 01/03/21     Galcanezumab-gnlm (EMGALITY) 120 MG/ML SOAJ Inject 240 mg subcutaneously once for 1 dose Initial loading dose 04/25/21     Galcanezumab-gnlm (EMGALITY) 120 MG/ML SOAJ Inject 120 mg subcutaneously every 28 (twenty-eight) days Start 1 month after loading dose 04/25/21     ibuprofen (ADVIL) 200 MG tablet Take 200 mg by mouth every 6 (six) hours as needed.    [provider]  mesalamine (APRISO) 0.375 g 24 hr capsule Take 2 capsules (0.75 g total) by mouth 2 (two) times daily. 12/29/20 03/29/21  Toney Reil, MD  rizatriptan (MAXALT) 10 MG tablet Take 1 tablet (10 mg total) by mouth as directed for Migraine May take a second dose after 2 hours if needed. 04/25/21       Allergies Lortab [hydrocodone-acetaminophen], Percocet [oxycodone-acetaminophen], and Stellaria  Family History  Problem Relation Age of Onset   Healthy Mother    COPD Father    Diabetes Father    Heart disease Father    Hypertension Father     Social History Social History   Tobacco Use   Smoking status: Never   Smokeless tobacco: Never  Vaping Use   Vaping Use: Never used  Substance Use Topics   Alcohol use: No   Drug use: No    Review of Systems   Review of Systems  Constitutional:  Negative for chills and fever.  Respiratory:  Negative for chest tightness and shortness of breath.   Cardiovascular:  Negative for chest pain.  Musculoskeletal:  Positive for back pain and neck pain.  Neurological:  Positive for numbness. Negative for weakness.   Physical Exam Updated Vital Signs BP (!) 147/103   Pulse 75   Temp 98.1 F (36.7 C) (Oral)   Resp 18   Ht 5\' 7"  (1.702 m)   Wt 102.5 kg   SpO2 98%   BMI 35.40 kg/m   Physical Exam Vitals and nursing note reviewed.  Constitutional:      General: She is not in acute distress.    Appearance: Normal appearance.  HENT:     Head: Normocephalic and atraumatic.  Eyes:     General: No scleral icterus.    Conjunctiva/sclera:  Conjunctivae normal.  Pulmonary:     Effort: Pulmonary effort is normal. No respiratory distress.     Breath sounds: No stridor.  Musculoskeletal:        General: No deformity or signs of injury.     Cervical back: Normal range of motion.     Comments: Tenderness to palpation in the thoracic midline around T1-T3  Midline cervical spinal tenderness, mild tenderness to palpation along the left trap  5 and 5 strength with elbow flexion and extension 4-5 strength with left wrist extension compared to right, 4-5 strength with finger abduction on the left compared to right  Sensation is subjectively diminished in the left posterior upper arm and fourth and fifth digits  Skin:    General: Skin is dry.     Coloration: Skin is not jaundiced or pale.  Neurological:     General: No  focal deficit present.     Mental Status: She is alert and oriented to person, place, and time. Mental status is at baseline.  Psychiatric:        Mood and Affect: Mood normal.        Behavior: Behavior normal.     LABS (all labs ordered are listed, but only abnormal results are displayed)  Labs Reviewed - No data to display ____________________________________________  EKG  N/a ____________________________________________  RADIOLOGY Almeta Monas, personally viewed and evaluated these images (plain radiographs) as part of my medical decision making, as well as reviewing the written report by the radiologist.  ED MD interpretation:  n/a    ____________________________________________   PROCEDURES  Procedure(s) performed (including Critical Care):  Procedures   ____________________________________________   INITIAL IMPRESSION / ASSESSMENT AND PLAN / ED COURSE    40 yo female with history of chronic back pain presents with upper back and neck pain as well as some numbness and weakness in the left arm.  Patient's typical back pain is really been confined to the upper thoracic region  without any radicular symptoms.  Had worsening of that pain as well as worsening pain along the left trap region with numbness and weakness in the left arm.  No lower extremity numbness weakness or bowel or bladder incontinence.  On exam she does have midline thoracic tenderness but no midline cervical tenderness and does have somewhat decreased strength with wrist extension and finger abduction on the left compared to the right. I suspect that she may have a concomitant cervical radiculopathy on top of her more chronic thoracic pain.  She does not show any signs or symptoms of myelopathy.  She does not have any risk factors for spinal epidural abscess as she is not immunosuppressed and has not any fevers or other systemic symptoms.  There was no trauma.  Given she has been taking ibuprofen will switch to naproxen to see if this has any improved effect.  I did also refer her to neurosurgery as if these radicular symptoms are ongoing she will likely need an MRI.  ____________________________________________   FINAL CLINICAL IMPRESSION(S) / ED DIAGNOSES  Final diagnoses:  Cervical radiculopathy     ED Discharge Orders          Ordered    naproxen (NAPROSYN) 250 MG tablet  2 times daily with meals        06/19/21 1331             Note:  This document was prepared using Dragon voice recognition software and may include unintentional dictation errors.    Rada Hay, MD 06/19/21 1514    Rada Hay, MD 06/19/21 1515

## 2021-06-19 NOTE — Discharge Instructions (Addendum)
Please start taking the naproxen twice a day for pain.  I would like you to see neurosurgery for evaluation as you may need an MRI at some point.  If you develop worsening weakness fevers or difficulty with gait, please return to the emergency department immediately.

## 2021-06-19 NOTE — ED Triage Notes (Signed)
Pt to ED for upper back pain that started a week ago. Hx back problems.  Denies injuries.

## 2021-06-27 DIAGNOSIS — Z20822 Contact with and (suspected) exposure to covid-19: Secondary | ICD-10-CM | POA: Diagnosis not present

## 2021-06-27 DIAGNOSIS — Z03818 Encounter for observation for suspected exposure to other biological agents ruled out: Secondary | ICD-10-CM | POA: Diagnosis not present

## 2021-07-05 ENCOUNTER — Other Ambulatory Visit: Payer: Self-pay | Admitting: Student

## 2021-07-05 DIAGNOSIS — R200 Anesthesia of skin: Secondary | ICD-10-CM

## 2021-07-18 ENCOUNTER — Ambulatory Visit: Payer: 59

## 2021-09-25 LAB — TSH: TSH: 0.66 (ref 0.41–5.90)

## 2021-09-25 LAB — VITAMIN B12: Vitamin B-12: 420

## 2021-10-07 IMAGING — US US DRAIN/INJ LARGE JOINT/BURSA
1 series · 9 of 9 positions shown · non-contrast
Comparison: Left knee MRI-06/26/2019

INDICATION: History of chronic left knee pain, acutely worsened now with concern
for symptomatic cephalad dissecting Baker's cyst. Please perform
ultrasound-guided aspiration and steroid injection for symptomatic
purposes.

EXAM:
US DRAIN/INJ LARGE JOINT/BURSA

[Series 1: us drain/inj large joint/bursa · 0.10mm/px · 9 of 9 slices shown]
[im 1/9]
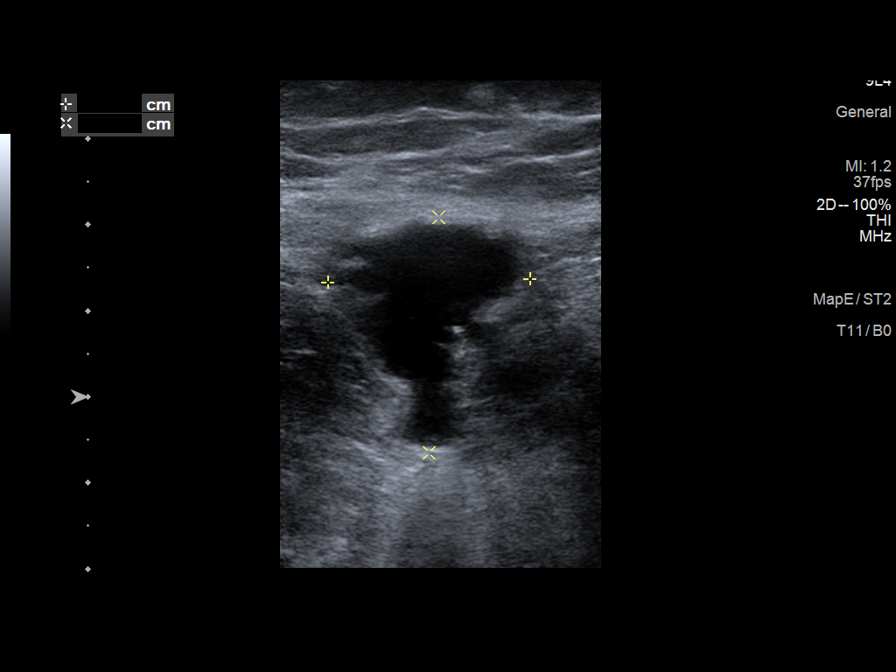
[im 2/9]
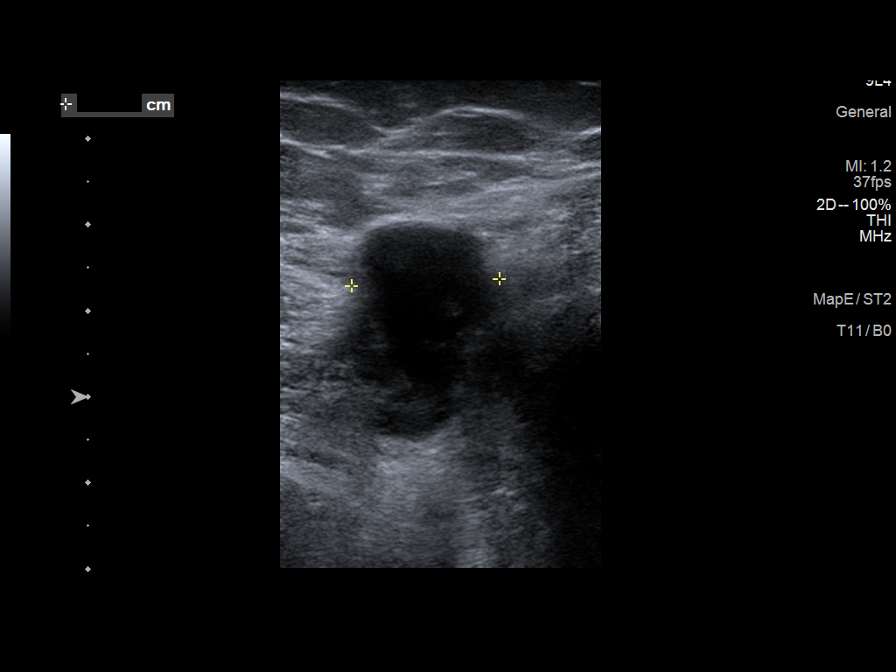
[im 3/9]
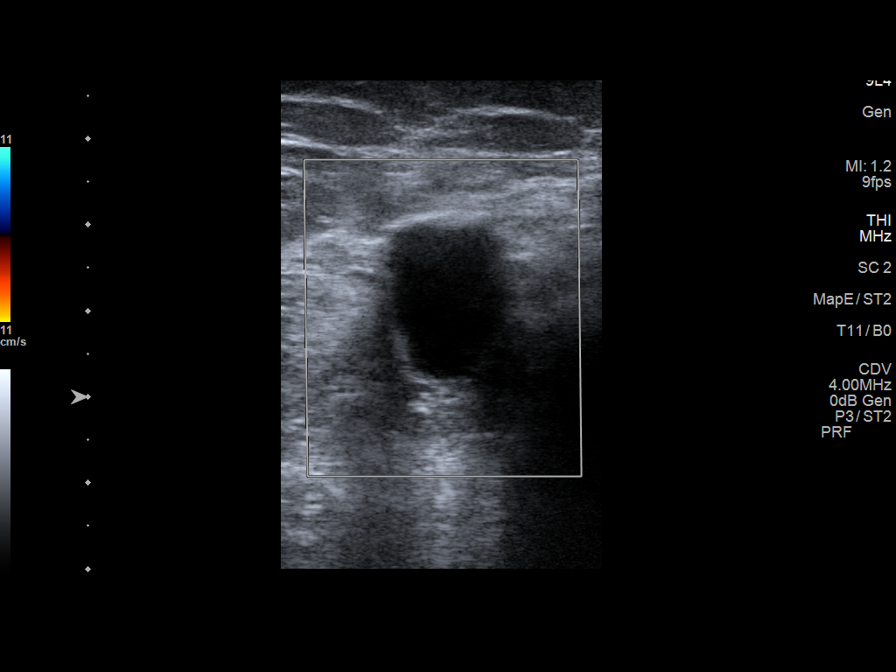
[im 4/9]
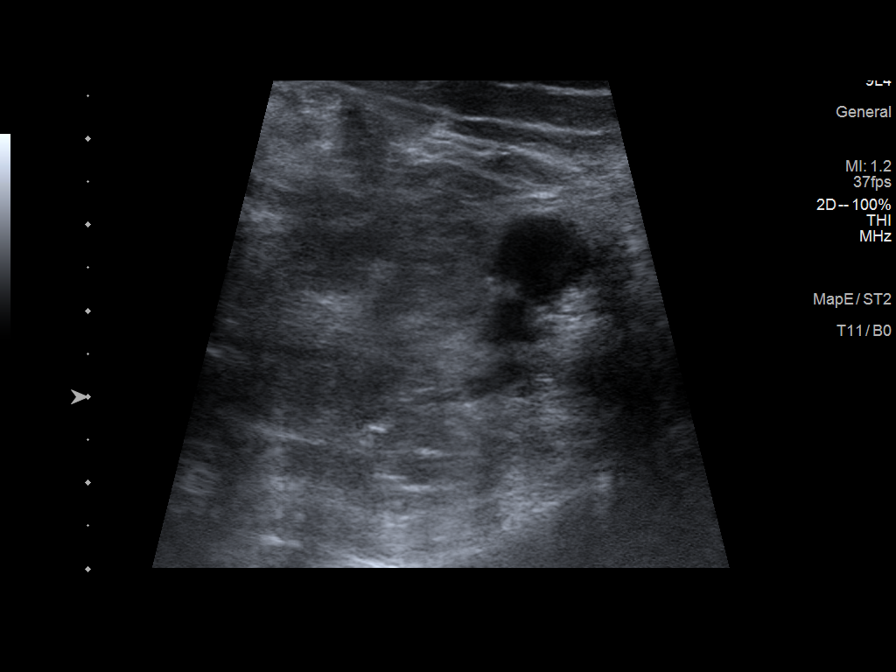
[im 5/9]
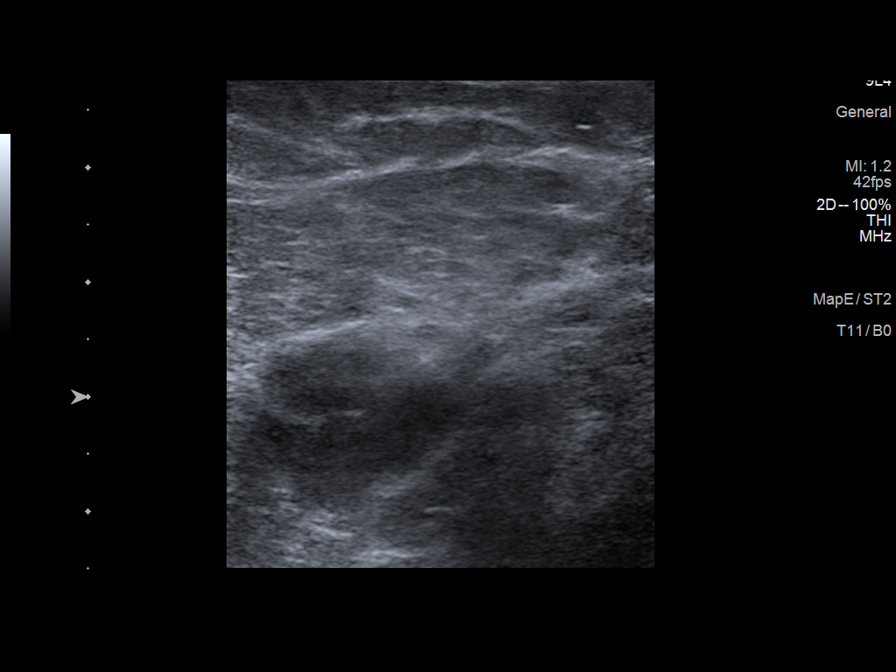
[im 6/9]
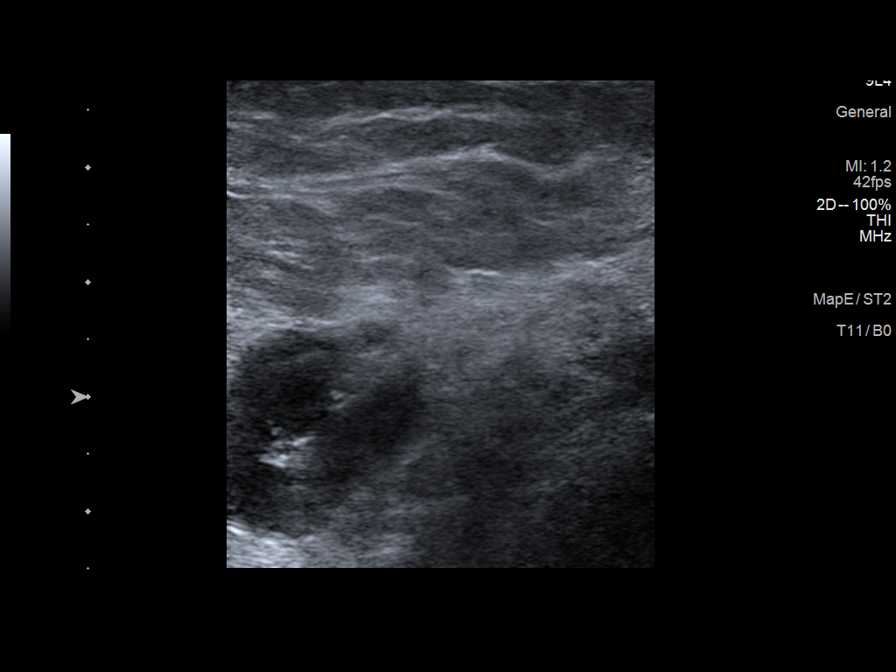
[im 7/9]
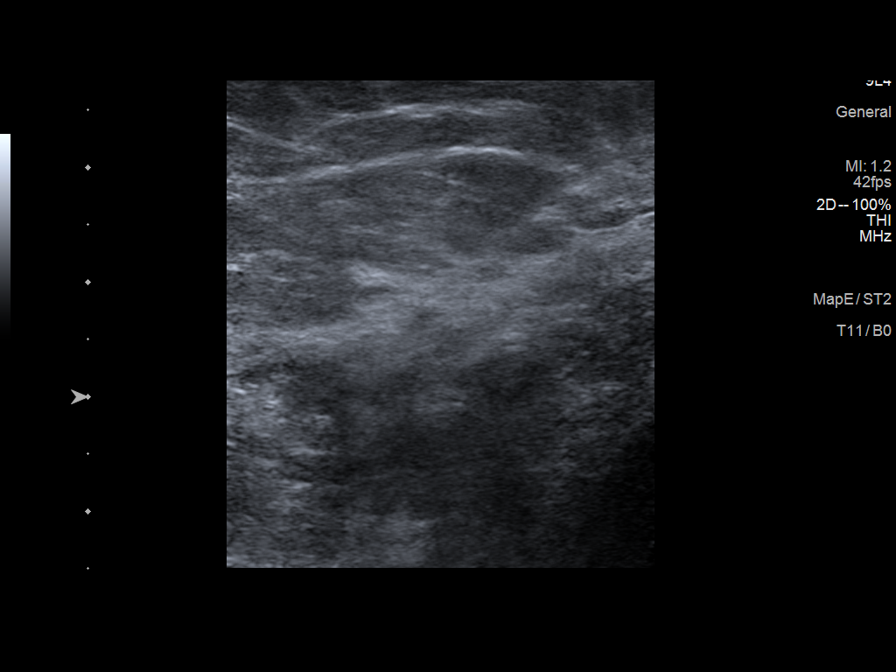
[im 8/9]
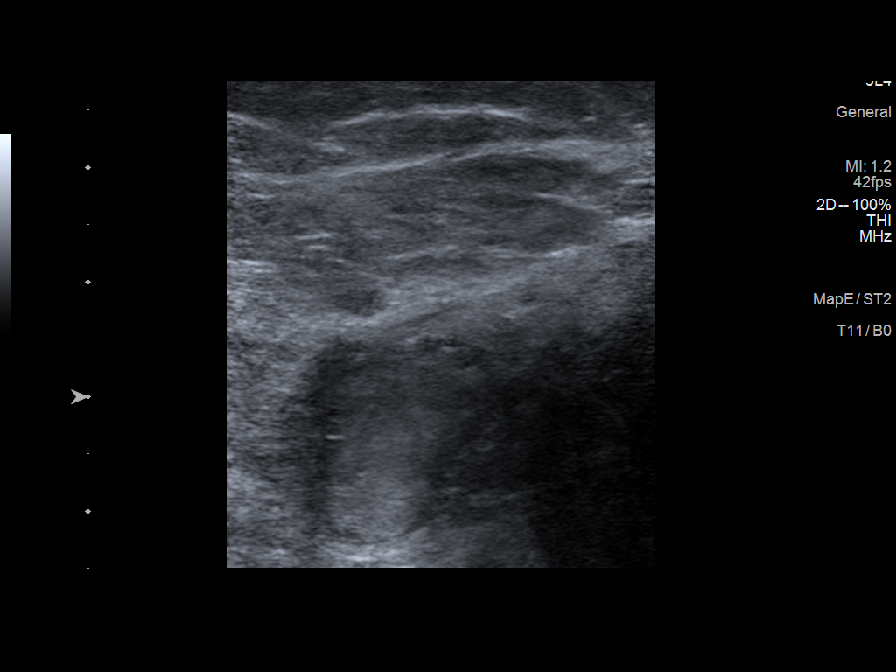
[im 9/9]
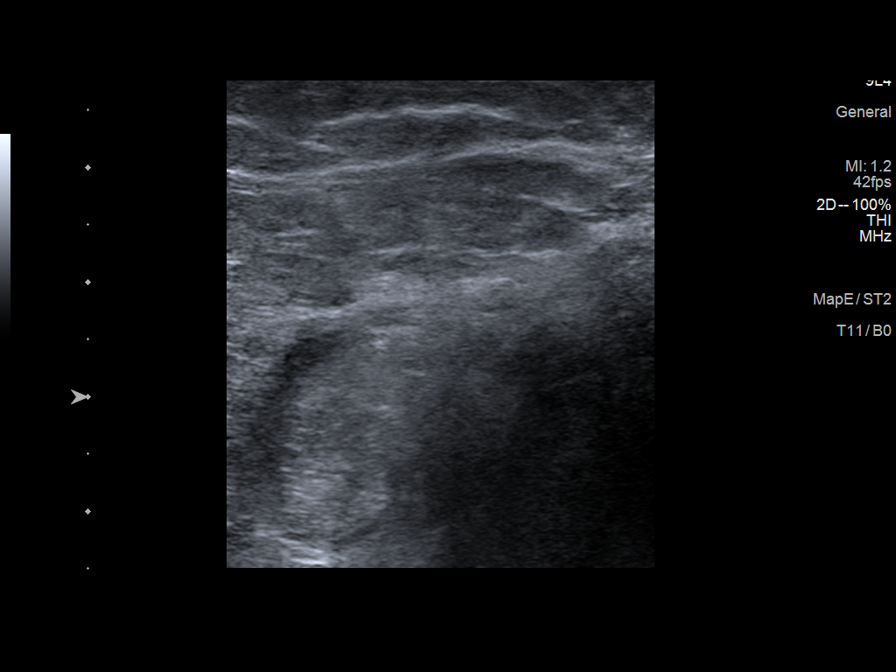

[9 of 9 positions shown; findings below may reference images not displayed]

MEDICATIONS:
80 mg Depo-Medrol diluted in 5 ml of 1% lidocaine

ANESTHESIA/SEDATION:
None

CONTRAST:  None

COMPLICATIONS:
None immediate.

PROCEDURE:
Informed written consent was obtained from the patient after a
discussion of the risks, benefits and alternatives to treatment. The
patient was placed supine on the ultrasound table with preprocedural
imaging demonstrating grossly unchanged appearance of the small
fluid collection about the superomedial aspect of the right knee
correlating with the suspected cephalad dissecting Baker's cyst
demonstrated on preceding knee MRI.

The skin overlying the posterior aspect of the left knee was prepped
and draped in usual sterile fashion. The overlying soft tissues were
anesthetized with 1% lidocaine.

An 21 gauge spinal needle was advanced into the dominant cystic
component Baker's cyst. Approximately 3 cc of serous, slightly
viscous fluid was aspirated from this dominant cystic component
resulting in near complete resolution the cyst. At this time, a
combination of 80 mg Depo-Medrol diluted in 5 ml of 1% lidocaine was
administered into the Baker's cyst. The needle was removed and
superficial hemostasis was achieved with manual compression. A
dressing was placed. The patient tolerated the procedure well
without immediate postprocedural complication.
IMPRESSION: Successful ultrasound-guided steroid injection and aspiration
approximately 3 cc of serous, slightly viscous fluid from the
dominant cystic component of the otherwise unchanged small cephalad
dissecting Baker's cyst.

## 2022-01-04 ENCOUNTER — Other Ambulatory Visit: Payer: Self-pay

## 2022-01-04 ENCOUNTER — Encounter: Payer: Self-pay | Admitting: Pharmacist

## 2022-01-04 MED ORDER — WEGOVY 0.5 MG/0.5ML ~~LOC~~ SOAJ: SUBCUTANEOUS | 0 refills | Status: DC

## 2022-02-02 ENCOUNTER — Other Ambulatory Visit (HOSPITAL_COMMUNITY): Payer: Self-pay

## 2022-02-02 MED ORDER — WEGOVY 1 MG/0.5ML ~~LOC~~ SOAJ
SUBCUTANEOUS | 1 refills | Status: DC
  Filled 2022-02-02: qty 2, 28d supply, fill #0
  Filled 2022-03-10: qty 2, 28d supply, fill #1

## 2022-02-08 ENCOUNTER — Other Ambulatory Visit (HOSPITAL_COMMUNITY): Payer: Self-pay

## 2022-02-08 ENCOUNTER — Other Ambulatory Visit: Payer: Self-pay

## 2022-02-08 MED ORDER — EMGALITY 120 MG/ML ~~LOC~~ SOAJ
SUBCUTANEOUS | 11 refills | Status: DC
  Filled 2022-02-08 – 2022-02-12 (×2): qty 1, 28d supply, fill #0
  Filled 2022-06-26 – 2022-06-27 (×2): qty 1, 30d supply, fill #0
  Filled 2022-07-23: qty 1, 30d supply, fill #1
  Filled 2022-08-21: qty 1, 28d supply, fill #2
  Filled 2022-08-31: qty 1, 30d supply, fill #2
  Filled 2022-09-22: qty 1, 30d supply, fill #3
  Filled 2022-10-22: qty 1, 30d supply, fill #4
  Filled 2022-11-14: qty 1, 30d supply, fill #5

## 2022-02-08 MED ORDER — NURTEC 75 MG PO TBDP
ORAL_TABLET | ORAL | 11 refills | Status: DC
  Filled 2022-02-08 – 2022-06-27 (×3): qty 16, 30d supply, fill #0
  Filled 2022-07-23: qty 16, 30d supply, fill #1
  Filled 2022-08-21: qty 16, 30d supply, fill #2
  Filled 2022-10-22: qty 16, 30d supply, fill #3
  Filled 2022-11-14: qty 16, 30d supply, fill #4
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
## 2022-02-12 ENCOUNTER — Other Ambulatory Visit: Payer: Self-pay

## 2022-02-14 ENCOUNTER — Other Ambulatory Visit: Payer: Self-pay

## 2022-02-16 ENCOUNTER — Other Ambulatory Visit: Payer: Self-pay

## 2022-02-27 ENCOUNTER — Other Ambulatory Visit: Payer: Self-pay

## 2022-03-10 ENCOUNTER — Other Ambulatory Visit (HOSPITAL_COMMUNITY): Payer: Self-pay

## 2022-03-16 ENCOUNTER — Encounter: Payer: Self-pay | Admitting: Nurse Practitioner

## 2022-03-16 ENCOUNTER — Ambulatory Visit (INDEPENDENT_AMBULATORY_CARE_PROVIDER_SITE_OTHER): Payer: No Typology Code available for payment source | Admitting: Nurse Practitioner

## 2022-03-16 VITALS — BP 100/72 | HR 92 | Temp 97.3°F | Resp 10 | Ht 67.0 in | Wt 216.4 lb

## 2022-03-16 DIAGNOSIS — Z8249 Family history of ischemic heart disease and other diseases of the circulatory system: Secondary | ICD-10-CM

## 2022-03-16 DIAGNOSIS — G43109 Migraine with aura, not intractable, without status migrainosus: Secondary | ICD-10-CM | POA: Diagnosis not present

## 2022-03-16 DIAGNOSIS — Z0000 Encounter for general adult medical examination without abnormal findings: Secondary | ICD-10-CM | POA: Diagnosis not present

## 2022-03-16 DIAGNOSIS — K50919 Crohn's disease, unspecified, with unspecified complications: Secondary | ICD-10-CM

## 2022-03-16 DIAGNOSIS — E6609 Other obesity due to excess calories: Secondary | ICD-10-CM

## 2022-03-16 DIAGNOSIS — Z6833 Body mass index (BMI) 33.0-33.9, adult: Secondary | ICD-10-CM

## 2022-03-16 LAB — LIPID PANEL
Cholesterol: 164 mg/dL (ref 0–200)
HDL: 40 mg/dL (ref >39.00)
LDL Cholesterol: 104 mg/dL — ABNORMAL HIGH (ref 0–99)
NonHDL: 123.82
Total CHOL/HDL Ratio: 4
Triglycerides: 101 mg/dL (ref 0.0–149.0)
VLDL: 20.2 mg/dL (ref 0.0–40.0)

## 2022-03-16 LAB — CBC
HCT: 41.5 % (ref 36.0–46.0)
Hemoglobin: 14 g/dL (ref 12.0–15.0)
MCHC: 33.6 g/dL (ref 30.0–36.0)
MCV: 92.3 fl (ref 78.0–100.0)
Platelets: 208 10*3/uL (ref 150.0–400.0)
RBC: 4.5 Mil/uL (ref 3.87–5.11)
RDW: 12.9 % (ref 11.5–15.5)
WBC: 7.1 10*3/uL (ref 4.0–10.5)

## 2022-03-16 LAB — COMPREHENSIVE METABOLIC PANEL
ALT: 10 U/L (ref 0–35)
AST: 14 U/L (ref 0–37)
Albumin: 4.2 g/dL (ref 3.5–5.2)
Alkaline Phosphatase: 75 U/L (ref 39–117)
BUN: 14 mg/dL (ref 6–23)
CO2: 28 mEq/L (ref 19–32)
Calcium: 9.1 mg/dL (ref 8.4–10.5)
Chloride: 106 mEq/L (ref 96–112)
Creatinine, Ser: 0.72 mg/dL (ref 0.40–1.20)
GFR: 103.81 mL/min (ref >60.00)
Glucose, Bld: 89 mg/dL (ref 70–99)
Potassium: 4 mEq/L (ref 3.5–5.1)
Sodium: 140 mEq/L (ref 135–145)
Total Bilirubin: 0.4 mg/dL (ref 0.2–1.2)
Total Protein: 6.8 g/dL (ref 6.0–8.3)

## 2022-03-16 LAB — HEMOGLOBIN A1C: Hgb A1c MFr Bld: 5.3 % (ref 4.6–6.5)

## 2022-03-16 LAB — TSH: TSH: 0.59 u[IU]/mL (ref 0.35–5.50)

## 2022-03-16 NOTE — Assessment & Plan Note (Signed)
Currently followed through [facility] clinic neurology.  Patient currently on Emgality and Nurtec.  Has well controlled with his medications per patient report.  Continue taking medication as prescribed and follow-up with neurologist as recommended.

## 2022-03-16 NOTE — Progress Notes (Signed)
New Patient Office Visit  Subjective    Patient ID: Erin Hull, female    DOB: [birth year]  Age: 41 y.o. MRN: [id number]  CC:  Chief Complaint  Patient presents with   Establish Care    [facility] but [facility] with Tressie Ellis and needs a CPE for insurance purposes.   Annual Exam    HPI [name] presents to establish care  Migraines: Dr. Clelia Croft at [place] [facility]. Managed by neuro. Was having 4-5 a week. With the medicatoins she has had 3 migraines in the past 7 months. With arua   Obesity: [name] manages patient wegovy.  Currently on 1 mg and tolerating that well is having trouble with constipation per patient report.  GYN: Dr [name] and [facility] manages her Pap smears.  Crohns: Followed by [name] in [place]. Was referred by the [place]. Was prescribed and never gotten it filled  for complete physical and follow up of chronic conditions.  Immunizations: -Tetanus:2022 -Influenza: out of season  -Covid-19: pizer x2 and one booster -Shingles: Too young -Pneumonia: Too young  -HPV: UTD  Diet: Fair diet. Currently on wegovy. States she will get 3-4 small a day, sometimes less  Exercise:  Increasing he rexercise. She does have some orthopedic history. Has a pelton twice a week  Eye exam: PRN Dental exam:  needs updating    Pap Smear: Completed in 2018 Mammogram: Completed in 2022  Colonoscopy: Completed in 12/20/2020 Lung Cancer Screening: N/A Dexa: Too young   Sleep: 930-10 bedtime and if she works she is up at 5-530 and if not she will get up at 7. Feels rested sometimes   Seen orhto in the past through the [place]. Recent [facility]  Outpatient Encounter Medications as of 03/16/2022  Medication Sig   acetaminophen (TYLENOL) 325 MG tablet Take 650 mg by mouth every 6 (six) hours as needed.   Galcanezumab-gnlm (EMGALITY) 120 MG/ML SOAJ Inject 120 mg subcutaneously every 28 (twenty-eight) days Start 1 month after loading dose   ibuprofen (ADVIL) 200 MG tablet Take  200 mg by mouth every 6 (six) hours as needed.   Rimegepant Sulfate (NURTEC) 75 MG TBDP Take 1 tablet (75 mg total) by mouth as directed   Semaglutide-Weight Management (WEGOVY) 1 MG/0.5ML SOAJ Inject 1 mg every week under the skin for four weeks.   Adalimumab 40 MG/0.4ML PNKT Inject into the skin. (Patient not taking: Reported on 03/16/2022)   [DISCONTINUED] amphetamine-dextroamphetamine (ADDERALL XR) 10 MG 24 hr capsule Take 10 mg by mouth daily.   [DISCONTINUED] amphetamine-dextroamphetamine (ADDERALL XR) 10 MG 24 hr capsule Take 1 capsule (10 mg total) by mouth every morning.   [DISCONTINUED] amphetamine-dextroamphetamine (ADDERALL) 10 MG tablet Take 10 mg by mouth daily.   [DISCONTINUED] amphetamine-dextroamphetamine (ADDERALL) 10 MG tablet Take 1 tablet by mouth daily as needed in afternoon for concentration   [DISCONTINUED] Galcanezumab-gnlm (EMGALITY) 120 MG/ML SOAJ Inject 240 mg subcutaneously once for 1 dose Initial loading dose   [DISCONTINUED] Galcanezumab-gnlm (EMGALITY) 120 MG/ML SOAJ Inject 120 mg subcutaneously every 28 (twenty-eight) days Start 1 month after loading dose   [DISCONTINUED] mesalamine (APRISO) 0.375 g 24 hr capsule Take 2 capsules (0.75 g total) by mouth 2 (two) times daily.   [DISCONTINUED] rizatriptan (MAXALT) 10 MG tablet Take 1 tablet (10 mg total) by mouth as directed for Migraine May take a second dose after 2 hours if needed.   [DISCONTINUED] Semaglutide-Weight Management (WEGOVY) 0.5 MG/0.5ML SOAJ Inject 0.5 mL every week by  subcutaneous route as directed for 28 days.   No facility-administered encounter medications on file as of 03/16/2022.    Past Medical History:  Diagnosis Date   Allergy    Seasonal   Anxiety    Arthritis    Complication of anesthesia    Crohn disease (HCC)    Ectopic pregnancy 05/2018   GERD (gastroesophageal reflux disease)    PONV (postoperative nausea and vomiting)     Past Surgical History:  Procedure Laterality Date    COLONOSCOPY WITH PROPOFOL N/A 12/20/2020   Procedure: COLONOSCOPY WITH PROPOFOL;  Surgeon: Toney Reil, MD;  Location: [facility] ENDOSCOPY;  Service: Gastroenterology;  Laterality: N/A;   DILATION AND EVACUATION N/A 06/07/2016   Procedure: DILATATION AND EVACUATION;  Surgeon: Christeen Douglas, MD;  Location: [facility] ORS;  Service: Gynecology;  Laterality: N/A;   KNEE ARTHROSCOPY Left    x2   LIPOMA EXCISION     back   TONSILLECTOMY      Family History  Problem Relation Age of Onset   Hyperlipidemia Mother    Arthritis Mother    Diabetes Mother    COPD Father    Diabetes Father    Heart disease Father    Hypertension Father    Arthritis Father    Stroke Father    Vision loss Father    Miscarriages / Stillbirths Sister    Diabetes Brother        type 1   Heart attack Maternal Grandmother 53       massive   Parkinson's disease Maternal Grandfather    Other Maternal Grandfather        passed away from heart complications   Pulmonary embolism Paternal Grandmother    Other Paternal Grandmother        brain aneurism   Throat cancer Paternal Grandfather     Social History   Socioeconomic History   Marital status: Divorced    Spouse name: Not on file   Number of children: 3   Years of education: Some college   Highest education level: Not on file  Occupational History   Not on file  Tobacco Use   Smoking status: Never    Passive exposure: Never   Smokeless tobacco: Never  Vaping Use   Vaping Use: Never used  Substance and Sexual Activity   Alcohol use: No   Drug use: No   Sexual activity: Not Currently    Birth control/protection: Abstinence  Other Topics Concern   Not on file  Social History Narrative   Currently in school: [employer] for nursing      Surg tech at [employer]   Social Determinants of Health   Financial Resource Strain: Not on file  Food Insecurity: Not on file  Transportation Needs: Not on file  Physical Activity: Not on file  Stress: Not on  file  Social Connections: Not on file  Intimate Partner Violence: Not on file    Review of Systems  Constitutional:  Positive for malaise/fatigue. Negative for chills and fever.  Respiratory:  Negative for shortness of breath.   Cardiovascular:  Positive for leg swelling (riding too long). Negative for chest pain.  Gastrointestinal:  Positive for constipation and nausea. Negative for abdominal pain, diarrhea and vomiting.  Genitourinary:  Negative for dysuria and hematuria.  Neurological:  Positive for headaches.  Psychiatric/Behavioral:  Negative for hallucinations and suicidal ideas.         Objective    BP 100/72   Pulse 92  Temp (!) 97.3 F (36.3 C)   Resp 10   Ht 5\' 7"  (1.702 m)   Wt 216 lb 6 oz (98.1 kg)   SpO2 96%   BMI 33.89 kg/m   Physical Exam Vitals and nursing note reviewed.  Constitutional:      Appearance: Normal appearance. She is obese.  HENT:     Right Ear: Tympanic membrane, ear canal and external ear normal.     Left Ear: Tympanic membrane, ear canal and external ear normal.     Mouth/Throat:     Mouth: Mucous membranes are moist.     Pharynx: Oropharynx is clear.  Eyes:     Extraocular Movements: Extraocular movements intact.  Cardiovascular:     Rate and Rhythm: Normal rate and regular rhythm.     Pulses: Normal pulses.     Heart sounds: Normal heart sounds.  Pulmonary:     Effort: Pulmonary effort is normal.     Breath sounds: Normal breath sounds.  Abdominal:     General: Bowel sounds are normal. There is no distension.     Palpations: There is no mass.     Tenderness: There is no abdominal tenderness.     Hernia: No hernia is present.  Musculoskeletal:        General: Tenderness present.     Left knee: Tenderness present over the MCL.     Right lower leg: No edema.     Left lower leg: No edema.  Lymphadenopathy:     Cervical: No cervical adenopathy.  Skin:    General: Skin is warm.  Neurological:     General: No focal deficit  present.     Mental Status: She is alert.     Deep Tendon Reflexes:     Reflex Scores:      Bicep reflexes are 2+ on the right side and 2+ on the left side.      Patellar reflexes are 2+ on the right side and 2+ on the left side.    Comments: Bilateral upper and lower extremity strength 5/5  Psychiatric:        Mood and Affect: Mood normal.        Behavior: Behavior normal.        Thought Content: Thought content normal.        Judgment: Judgment normal.         Assessment & Plan:   Problem List Items Addressed This Visit       Cardiovascular and Mediastinum   Migraine with aura and without status migrainosus, not intractable    Currently followed through [facility] neurolog[facility].  Patient currently on Emgality and Nurtec.  Has well controlled with his medications per patient report.  Continue taking medication as prescribed and follow-up with neurologist as recommended.      Relevant Medications   Adalimumab 40 MG/0.4ML PNKT   Family history of cerebral aneurysm    Patient mention she has had MRIs through neurology in regards to her migraine condition and has not been told she has aneurysms.        Other   Crohn disease (HCC)    Has been evaluated by GI and [place].  They did recommend her to go on Humira but per patient report was never filled by the [facility].  Continue following with a gastrointestinal specialist      Preventative health care - Primary    Discussed age-appropriate immunizations and screening exams.      Relevant Orders  CBC   Lipid panel   Comprehensive metabolic panel   Hemoglobin A1c   TSH   Class 1 obesity due to excess calories without serious comorbidity with body mass index (BMI) of 33.0 to 33.9 in adult    Patient currently working on lifestyle modifications.  She is increasing her physical activity along with the clinician for weight and currently on semaglutide 1 mg.  Continue working on healthy lifestyle modifications        Return in about 1 year (around 03/17/2023) for CPE and labs.   Audria Nine, NP

## 2022-03-16 NOTE — Assessment & Plan Note (Signed)
Patient currently working on lifestyle modifications.  She is increasing her physical activity along with the clinician for weight and currently on semaglutide 1 mg.  Continue working on healthy lifestyle modifications

## 2022-03-16 NOTE — Assessment & Plan Note (Signed)
Discussed age-appropriate immunizations and screening exams. 

## 2022-03-16 NOTE — Assessment & Plan Note (Signed)
Has been evaluated by GI and [place].  They did recommend her to go on Humira but per patient report was never filled by the [place].  Continue following with a gastrointestinal specialist

## 2022-03-16 NOTE — Patient Instructions (Signed)
Nice to see you today I will be in touch with labs once I have them Follow up with me in 1 year for your physical, sooner if you need me

## 2022-03-16 NOTE — Assessment & Plan Note (Signed)
Patient mention she has had MRIs through neurology in regards to her migraine condition and has not been told she has aneurysms.

## 2022-04-13 ENCOUNTER — Other Ambulatory Visit (HOSPITAL_COMMUNITY): Payer: Self-pay

## 2022-04-13 MED ORDER — WEGOVY 1.7 MG/0.75ML ~~LOC~~ SOAJ
SUBCUTANEOUS | 5 refills | Status: DC
  Filled 2022-04-13: qty 3, 28d supply, fill #0
  Filled 2022-05-06: qty 3, 28d supply, fill #1
  Filled 2022-06-06: qty 3, 28d supply, fill #2
  Filled 2022-06-27: qty 3, 28d supply, fill #3
  Filled 2022-07-23: qty 3, 28d supply, fill #4
  Filled 2022-08-21 – 2022-08-22 (×2): qty 3, 28d supply, fill #5

## 2022-05-06 ENCOUNTER — Other Ambulatory Visit: Payer: Self-pay

## 2022-05-07 ENCOUNTER — Other Ambulatory Visit: Payer: Self-pay

## 2022-06-06 ENCOUNTER — Other Ambulatory Visit: Payer: Self-pay

## 2022-06-08 ENCOUNTER — Other Ambulatory Visit: Payer: Self-pay

## 2022-06-26 ENCOUNTER — Other Ambulatory Visit: Payer: Self-pay

## 2022-06-27 ENCOUNTER — Other Ambulatory Visit: Payer: Self-pay

## 2022-06-28 ENCOUNTER — Other Ambulatory Visit: Payer: Self-pay

## 2022-07-23 ENCOUNTER — Other Ambulatory Visit: Payer: Self-pay

## 2022-07-30 ENCOUNTER — Encounter: Payer: Self-pay | Admitting: Nurse Practitioner

## 2022-07-30 ENCOUNTER — Ambulatory Visit (INDEPENDENT_AMBULATORY_CARE_PROVIDER_SITE_OTHER): Payer: No Typology Code available for payment source | Admitting: Nurse Practitioner

## 2022-07-30 VITALS — BP 110/72 | HR 6 | Temp 97.4°F | Ht 67.0 in | Wt 190.0 lb

## 2022-07-30 DIAGNOSIS — M6740 Ganglion, unspecified site: Secondary | ICD-10-CM | POA: Diagnosis not present

## 2022-07-30 DIAGNOSIS — N6310 Unspecified lump in the right breast, unspecified quadrant: Secondary | ICD-10-CM

## 2022-07-30 NOTE — Patient Instructions (Signed)
Nice to see you today I have placed the order for you to get a Mammogram. Give them a call You can make an appointment with Dr. Patsy Lager when you leave today about your wrist.

## 2022-07-30 NOTE — Assessment & Plan Note (Signed)
Palpable on exam.  Differentials could be a fibrous breast not duct ectasia, or mass.  Will do diagnostic bilateral mammogram.  Ambulatory order placed and patient given information to call and set up appointment at her convenience.

## 2022-07-30 NOTE — Progress Notes (Signed)
Acute Office Visit  Subjective:     Patient ID: Erin Hull, female    DOB: June 05, 1981, 41 y.o.   MRN: [id number]  Chief Complaint  Patient presents with   Breast Mass    right   Wrist Pain    right    HPI Patient is in today for multiple complaints  Breast issue: States that it si on the right side. States that she was having pain for approx 1 month. States that she thought it was related to period. States that she has not been able to get her screening. States that last mammo was told that she had fibirous breast. States that the nipple is normal. States that nipple discharge or bleeding. No fh history of breast cancer Mom has fibrous breast  Wrist pain: States that it is the right. States that she has a history of wirst pain. States that she has been seen at the [place] from it. States that she did notice a cyst. States that the pain is all the time. States that she does a lot of repaetive motion with Dance movement psychotherapist. No numbness or tingling. She states that she does have asome  weakness. States that she is right handed "technically". Has tried ibuprofen and tylenol. No splinting. Has been seen by ortho in the past  Review of Systems  Constitutional:  Negative for chills and fever.  Cardiovascular:  Negative for chest pain.  Musculoskeletal:  Positive for joint pain.  Neurological:  Positive for focal weakness. Negative for tingling.        Objective:    BP 110/72 (BP Location: Left Arm, Patient Position: Sitting)   Pulse (!) 6   Temp (!) 97.4 F (36.3 C) (Skin)   Ht 5\' 7"  (1.702 m)   Wt 190 lb (86.2 kg)   LMP 07/26/2022   SpO2 98%   BMI 29.76 kg/m    Physical Exam Vitals and nursing note reviewed. Exam conducted with a chaperone present 07/28/2022, LPN).  Constitutional:      Appearance: Normal appearance.  Cardiovascular:     Rate and Rhythm: Normal rate and regular rhythm.     Heart sounds: Normal heart sounds.  Pulmonary:     Effort: Pulmonary effort is  normal.     Breath sounds: Normal breath sounds.  Chest:    Musculoskeletal:     Right wrist: No swelling, deformity or effusion. Normal range of motion. Normal pulse.       Arms:  Lymphadenopathy:     Cervical: No cervical adenopathy.  Neurological:     Mental Status: She is alert.     Deep Tendon Reflexes:     Reflex Scores:      Bicep reflexes are 2+ on the right side and 2+ on the left side.    No results found for any visits on 07/30/22.      Assessment & Plan:   Problem List Items Addressed This Visit       Other   Mass of right breast - Primary    Palpable on exam.  Differentials could be a fibrous breast not duct ectasia, or mass.  Will do diagnostic bilateral mammogram.  Ambulatory order placed and patient given information to call and set up appointment at her convenience.      Relevant Orders   MM Digital Diagnostic Bilat   Ganglion cyst    Ganglion cyst appreciated on exam today patient also having accompanying wrist pain.  Has seen orthopedics in  the past.  Did refer patient to Dr. Patsy Lager in office sports medicine physician she can make an appointment at her convenience.       No orders of the defined types were placed in this encounter.   Return if symptoms worsen or fail to improve.  Audria Nine, NP

## 2022-07-30 NOTE — Assessment & Plan Note (Signed)
Ganglion cyst appreciated on exam today patient also having accompanying wrist pain.  Has seen orthopedics in the past.  Did refer patient to Dr. Patsy Lager in office sports medicine physician she can make an appointment at her convenience.

## 2022-07-31 ENCOUNTER — Other Ambulatory Visit: Payer: Self-pay | Admitting: Nurse Practitioner

## 2022-07-31 ENCOUNTER — Encounter: Payer: Self-pay | Admitting: *Deleted

## 2022-07-31 DIAGNOSIS — N6310 Unspecified lump in the right breast, unspecified quadrant: Secondary | ICD-10-CM

## 2022-08-21 ENCOUNTER — Other Ambulatory Visit: Payer: Self-pay

## 2022-08-22 ENCOUNTER — Telehealth: Payer: Self-pay | Admitting: *Deleted

## 2022-08-22 ENCOUNTER — Other Ambulatory Visit: Payer: Self-pay

## 2022-08-22 ENCOUNTER — Ambulatory Visit: Payer: No Typology Code available for payment source

## 2022-08-22 NOTE — Telephone Encounter (Signed)
Patient was scheduled for a nurse visit today for a Tdap. Patient had a Tdap 09/13/09 and a tetanus 03/14/21. Patient is up to date with her tetanus vaccines. Called and spoke to patient to advise her that she is up to date. 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 Patient stated that she also had a Tdap 08/16/2012 when she was pregnant so she has actually had two. Patient was advised the requirement is one and to follow up with tetanus every 10 years.  Spoke to [employer] NP and advised him of the situation. Matt said to let the patient know if she needs a letter for nursing school he will glad to provide one that she is up to date.Patient informed and nurse visit cancelled.

## 2022-08-29 ENCOUNTER — Other Ambulatory Visit: Payer: Self-pay | Admitting: *Deleted

## 2022-08-29 ENCOUNTER — Inpatient Hospital Stay
Admission: RE | Admit: 2022-08-29 | Discharge: 2022-08-29 | Disposition: A | Discharge status: Home / Self Care | Payer: Self-pay | Source: Ambulatory Visit | Attending: *Deleted | Admitting: *Deleted

## 2022-08-29 ENCOUNTER — Ambulatory Visit
Admission: RE | Admit: 2022-08-29 | Discharge: 2022-08-29 | Disposition: A | Discharge status: Home / Self Care | Payer: 59 | Source: Ambulatory Visit | Attending: Nurse Practitioner | Admitting: Nurse Practitioner

## 2022-08-29 DIAGNOSIS — N6310 Unspecified lump in the right breast, unspecified quadrant: Secondary | ICD-10-CM | POA: Diagnosis not present

## 2022-08-29 DIAGNOSIS — Z1231 Encounter for screening mammogram for malignant neoplasm of breast: Secondary | ICD-10-CM

## 2022-08-29 DIAGNOSIS — N644 Mastodynia: Secondary | ICD-10-CM | POA: Diagnosis not present

## 2022-08-31 ENCOUNTER — Other Ambulatory Visit: Payer: Self-pay

## 2022-09-22 ENCOUNTER — Other Ambulatory Visit: Payer: Self-pay

## 2022-09-23 ENCOUNTER — Other Ambulatory Visit: Payer: Self-pay

## 2022-09-24 ENCOUNTER — Ambulatory Visit: Admit: 2022-09-24 | Discharge status: Absent without leave | Payer: No Typology Code available for payment source

## 2022-09-24 ENCOUNTER — Other Ambulatory Visit: Payer: Self-pay

## 2022-09-24 MED ORDER — WEGOVY 1.7 MG/0.75ML ~~LOC~~ SOAJ
1.7000 mg | SUBCUTANEOUS | 5 refills | Status: DC
  Filled 2022-09-24 – 2022-09-27 (×2): qty 3, 28d supply, fill #0
  Filled 2022-11-07: qty 3, 28d supply, fill #1

## 2022-09-27 ENCOUNTER — Other Ambulatory Visit: Payer: Self-pay

## 2022-10-01 ENCOUNTER — Other Ambulatory Visit: Payer: Self-pay

## 2022-10-11 DIAGNOSIS — Z713 Dietary counseling and surveillance: Secondary | ICD-10-CM | POA: Diagnosis not present

## 2022-10-11 DIAGNOSIS — Z6828 Body mass index (BMI) 28.0-28.9, adult: Secondary | ICD-10-CM | POA: Diagnosis not present

## 2022-10-15 ENCOUNTER — Other Ambulatory Visit: Payer: Self-pay

## 2022-10-15 MED ORDER — WEGOVY 1.7 MG/0.75ML ~~LOC~~ SOAJ
1.7000 mg | SUBCUTANEOUS | 0 refills | Status: DC
  Filled 2022-10-15: qty 9, 84d supply, fill #0
  Filled 2022-10-22: qty 3, 28d supply, fill #0
  Filled 2022-11-23 – 2022-12-07 (×3): qty 3, 28d supply, fill #1

## 2022-10-19 ENCOUNTER — Other Ambulatory Visit: Payer: Self-pay

## 2022-10-22 ENCOUNTER — Other Ambulatory Visit: Payer: Self-pay

## 2022-11-07 ENCOUNTER — Other Ambulatory Visit: Payer: Self-pay
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
## 2022-11-14 ENCOUNTER — Other Ambulatory Visit: Payer: Self-pay

## 2022-11-14 MED ORDER — EMGALITY 120 MG/ML ~~LOC~~ SOAJ
120.0000 mg | SUBCUTANEOUS | 11 refills | Status: DC
  Filled 2022-11-14 – 2022-11-23 (×2): qty 1, 28d supply, fill #0
  Filled 2022-12-03: qty 1, 30d supply, fill #0
  Filled 2022-12-07: qty 1, 28d supply, fill #0
  Filled 2023-01-04: qty 1, 28d supply, fill #1
  Filled 2023-02-12: qty 1, 28d supply, fill #2
  Filled 2023-03-17: qty 1, 28d supply, fill #3
  Filled 2023-04-10: qty 1, 28d supply, fill #4
  Filled 2023-05-14: qty 1, 28d supply, fill #5
  Filled 2023-06-07: qty 1, 28d supply, fill #6
  Filled 2023-07-13: qty 1, 28d supply, fill #7
  Filled 2023-08-10: qty 1, 28d supply, fill #8
  Filled 2023-08-14: qty 1, 30d supply, fill #8
  Filled 2023-08-20: qty 1, 28d supply, fill #8
  Filled 2023-09-12: qty 1, 30d supply, fill #9
  Filled 2023-09-19: qty 1, 28d supply, fill #9
  Filled 2023-10-19: qty 1, 30d supply, fill #10

## 2022-11-23 ENCOUNTER — Other Ambulatory Visit: Payer: Self-pay

## 2022-12-03 ENCOUNTER — Other Ambulatory Visit: Payer: Self-pay

## 2022-12-04 ENCOUNTER — Other Ambulatory Visit: Payer: Self-pay

## 2022-12-07 ENCOUNTER — Other Ambulatory Visit: Payer: Self-pay

## 2022-12-10 ENCOUNTER — Other Ambulatory Visit: Payer: Self-pay

## 2023-01-04 ENCOUNTER — Other Ambulatory Visit: Payer: Self-pay

## 2023-01-17 ENCOUNTER — Other Ambulatory Visit: Payer: Self-pay | Admitting: Surgery

## 2023-01-23 ENCOUNTER — Other Ambulatory Visit: Payer: Self-pay

## 2023-01-23 ENCOUNTER — Encounter
Admission: RE | Admit: 2023-01-23 | Discharge: 2023-01-23 | Disposition: A | Discharge status: Home / Self Care | Payer: No Typology Code available for payment source | Source: Ambulatory Visit | Attending: Surgery | Admitting: Surgery

## 2023-01-23 VITALS — Ht 67.0 in | Wt 170.0 lb

## 2023-01-23 DIAGNOSIS — Z01812 Encounter for preprocedural laboratory examination: Secondary | ICD-10-CM

## 2023-01-23 NOTE — Patient Instructions (Addendum)
Your procedure is scheduled on: Thursday January 31, 2023 Report to the Registration Desk on the 1st floor of the [facility] To find out your arrival time, please call [phone] between 1PM - 3PM on: Wednesday January 30, 2023. If your arrival time is 6:00 am, do not arrive before that time as the [facility] entrance doors do not open until 6:00 am.  REMEMBER: Instructions that are not followed completely may result in serious medical risk, up to and including death; or upon the discretion of your surgeon and anesthesiologist your surgery may need to be rescheduled.  Do not eat food after midnight the night before surgery.  No gum chewing or hard candies.  You may however, drink CLEAR liquids up to 2 hours before you are scheduled to arrive for your surgery. Do not drink anything within 2 hours of your scheduled arrival time.  Clear liquids include: - water  - apple juice without pulp - gatorade (not RED colors) - black coffee or tea (Do NOT add milk or creamers to the coffee or tea) Do NOT drink anything that is not on this list.   In addition, your doctor has ordered for you to drink the provided:  Ensure Pre-Surgery Clear Carbohydrate Drink  Drinking this carbohydrate drink up to two hours before surgery helps to reduce insulin resistance and improve patient outcomes. Please complete drinking 2 hours before scheduled arrival time.  One week prior to surgery: Stop Anti-inflammatories (NSAIDS) such as Advil, Aleve, Ibuprofen, Motrin, Naproxen, Naprosyn and Aspirin based products such as Excedrin, Goody's Powder, BC Powder. Stop ANY OVER THE COUNTER supplements until after surgery. You may however, continue to take Tylenol if needed for pain up until the day of surgery.  Continue taking all prescribed medications with the exception of the following: Stop Semaglutide-Weight Management (WEGOVY) 1.7 MG/0.75ML SOAJ 7 days before your procedure.  Follow recommendations from  Cardiologist or PCP regarding stopping blood thinners.  TAKE ONLY THESE MEDICATIONS THE MORNING OF SURGERY WITH A SIP OF WATER:  None    No Alcohol for 24 hours before or after surgery.  No Smoking including e-cigarettes for 24 hours before surgery.  No chewable tobacco products for at least 6 hours before surgery.  No nicotine patches on the day of surgery.  Do not use any "recreational" drugs for at least a week (preferably 2 weeks) before your surgery.  Please be advised that the combination of cocaine and anesthesia may have negative outcomes, up to and including death. If you test positive for cocaine, your surgery will be cancelled.  On the morning of surgery brush your teeth with toothpaste and water, you may rinse your mouth with mouthwash if you wish. Do not swallow any toothpaste or mouthwash.  Use CHG Soap or wipes as directed on instruction sheet.  Do not wear jewelry, make-up, hairpins, clips or nail polish.  Do not wear lotions, powders, or perfumes.   Do not shave body hair from the neck down 48 hours before surgery.  Contact lenses, hearing aids and dentures may not be worn into surgery.  Do not bring valuables to the hospital. [facility] is not responsible for any missing/lost belongings or valuables. .   Notify your doctor if there is any change in your medical condition (cold, fever, infection).  Wear comfortable clothing (specific to your surgery type) to the hospital.  After surgery, you can help prevent lung complications by doing breathing exercises.  Take deep breaths and cough every 1-2 hours.  Your doctor may order a device called an Incentive Spirometer to help you take deep breaths. When coughing or sneezing, hold a pillow firmly against your incision with both hands. This is called "splinting." Doing this helps protect your incision. It also decreases belly discomfort.  If you are being admitted to the hospital overnight, leave your suitcase in the  car. After surgery it may be brought to your room.  In case of increased patient census, it may be necessary for you, the patient, to continue your postoperative care in the Same Day Surgery department.  If you are being discharged the day of surgery, you will not be allowed to drive home. You will need a responsible individual to drive you home and stay with you for 24 hours after surgery.   If you are taking public transportation, you will need to have a responsible individual with you.  Please call the Pre-admissions Testing Dept. at [phone] if you have any questions about these instructions.  Surgery Visitation Policy:  Patients having surgery or a procedure may have two visitors.  Children under the age of 71 must have an adult with them who is not the patient.  Inpatient Visitation:    Visiting hours are 7 a.m. to 8 p.m. Up to four visitors are allowed at one time in a patient room. The visitors may rotate out with other people during the day.  One visitor age 14 or older may stay with the patient overnight and must be in the room by 8 p.m.    Preparing for Surgery with CHLORHEXIDINE GLUCONATE (CHG) Soap  Chlorhexidine Gluconate (CHG) Soap  o An antiseptic cleaner that kills germs and bonds with the skin to continue killing germs even after washing  o Used for showering the night before surgery and morning of surgery  Before surgery, you can play an important role by reducing the number of germs on your skin.  CHG (Chlorhexidine gluconate) soap is an antiseptic cleanser which kills germs and bonds with the skin to continue killing germs even after washing.  Please do not use if you have an allergy to CHG or antibacterial soaps. If your skin becomes reddened/irritated stop using the CHG.  1. Shower the NIGHT BEFORE SURGERY and the MORNING OF SURGERY with CHG soap.  2. If you choose to wash your hair, wash your hair first as usual with your normal shampoo.  3.  After shampooing, rinse your hair and body thoroughly to remove the shampoo.  4. Use CHG as you would any other liquid soap. You can apply CHG directly to the skin and wash gently with a scrungie or a clean washcloth.  5. Apply the CHG soap to your body only from the neck down. Do not use on open wounds or open sores. Avoid contact with your eyes, ears, mouth, and genitals (private parts). Wash face and genitals (private parts) with your normal soap.  6. Wash thoroughly, paying special attention to the area where your surgery will be performed.  7. Thoroughly rinse your body with warm water.  8. Do not shower/wash with your normal soap after using and rinsing off the CHG soap.  9. Pat yourself dry with a clean towel.  10. Wear clean pajamas to bed the night before surgery.  12. Place clean sheets on your bed the night of your first shower and do not sleep with pets.  13. Shower again with the CHG soap on the day of surgery prior to arriving at the  hospital.  14. Do not apply any deodorants/lotions/powders.  15. Please wear clean clothes to the hospital.

## 2023-01-30 MED ORDER — CHLORHEXIDINE GLUCONATE 0.12 % MT SOLN
15.0000 mL | Freq: Once | OROMUCOSAL | Status: AC
  Administered 2023-01-31: 15 mL via OROMUCOSAL

## 2023-01-30 MED ORDER — LACTATED RINGERS IV SOLN: INTRAVENOUS | Status: DC

## 2023-01-30 MED ORDER — ORAL CARE MOUTH RINSE: 15.0000 mL | Freq: Once | OROMUCOSAL | Status: AC

## 2023-01-30 MED ORDER — FAMOTIDINE 20 MG PO TABS
20.0000 mg | ORAL_TABLET | Freq: Once | ORAL | Status: AC
  Administered 2023-01-31: 20 mg via ORAL

## 2023-01-30 MED ORDER — CEFAZOLIN SODIUM-DEXTROSE 2-4 GM/100ML-% IV SOLN
2.0000 g | INTRAVENOUS | Status: AC
  Administered 2023-01-31: 2 g via INTRAVENOUS

## 2023-01-31 ENCOUNTER — Ambulatory Visit
Admission: RE | Admit: 2023-01-31 | Discharge: 2023-01-31 | Disposition: A | Discharge status: Home / Self Care | Payer: No Typology Code available for payment source | Attending: Surgery | Admitting: Surgery

## 2023-01-31 ENCOUNTER — Ambulatory Visit: Payer: No Typology Code available for payment source | Admitting: Anesthesiology

## 2023-01-31 ENCOUNTER — Ambulatory Visit: Payer: No Typology Code available for payment source

## 2023-01-31 ENCOUNTER — Encounter
Admission: RE | Disposition: A | Discharge status: Home / Self Care | Payer: Self-pay | Source: Home / Self Care | Attending: Surgery

## 2023-01-31 ENCOUNTER — Encounter: Payer: Self-pay | Admitting: Surgery

## 2023-01-31 ENCOUNTER — Other Ambulatory Visit: Payer: Self-pay

## 2023-01-31 DIAGNOSIS — S83512A Sprain of anterior cruciate ligament of left knee, initial encounter: Secondary | ICD-10-CM | POA: Diagnosis not present

## 2023-01-31 DIAGNOSIS — Z01812 Encounter for preprocedural laboratory examination: Secondary | ICD-10-CM

## 2023-01-31 DIAGNOSIS — X501XXA Overexertion from prolonged static or awkward postures, initial encounter: Secondary | ICD-10-CM | POA: Diagnosis not present

## 2023-01-31 DIAGNOSIS — G8918 Other acute postprocedural pain: Secondary | ICD-10-CM | POA: Diagnosis not present

## 2023-01-31 HISTORY — PX: KNEE ARTHROSCOPY WITH ANTERIOR CRUCIATE LIGAMENT (ACL) REPAIR: SHX5644

## 2023-01-31 LAB — POCT PREGNANCY, URINE: Preg Test, Ur: NEGATIVE

## 2023-01-31 SURGERY — KNEE ARTHROSCOPY WITH ANTERIOR CRUCIATE LIGAMENT (ACL) REPAIR
Anesthesia: General | Site: Knee | Laterality: Left

## 2023-01-31 MED ORDER — KETOROLAC TROMETHAMINE 30 MG/ML IJ SOLN
INTRAMUSCULAR | Status: AC
  Filled 2023-01-31: qty 1

## 2023-01-31 MED ORDER — TRAMADOL HCL 50 MG PO TABS: 50.0000 mg | ORAL_TABLET | Freq: Four times a day (QID) | ORAL | 0 refills | Status: AC | PRN

## 2023-01-31 MED ORDER — ONDANSETRON HCL 4 MG/2ML IJ SOLN: 4.0000 mg | Freq: Once | INTRAMUSCULAR | Status: DC | PRN

## 2023-01-31 MED ORDER — MIDAZOLAM HCL 2 MG/2ML IJ SOLN
INTRAMUSCULAR | Status: AC
  Filled 2023-01-31: qty 2

## 2023-01-31 MED ORDER — KETOROLAC TROMETHAMINE 30 MG/ML IJ SOLN: 30.0000 mg | Freq: Once | INTRAMUSCULAR | Status: DC

## 2023-01-31 MED ORDER — PROPOFOL 10 MG/ML IV BOLUS
INTRAVENOUS | Status: AC
  Filled 2023-01-31: qty 20

## 2023-01-31 MED ORDER — FENTANYL CITRATE (PF) 100 MCG/2ML IJ SOLN
INTRAMUSCULAR | Status: AC
  Filled 2023-01-31: qty 2

## 2023-01-31 MED ORDER — BUPIVACAINE LIPOSOME 1.3 % IJ SUSP
INTRAMUSCULAR | Status: DC | PRN
  Administered 2023-01-31: 10 mL via PERINEURAL

## 2023-01-31 MED ORDER — TRAMADOL HCL 50 MG PO TABS
50.0000 mg | ORAL_TABLET | Freq: Four times a day (QID) | ORAL | Status: DC | PRN
  Administered 2023-01-31: 50 mg via ORAL

## 2023-01-31 MED ORDER — RINGERS IRRIGATION IR SOLN
Status: DC | PRN
  Administered 2023-01-31: 3000 mL
  Administered 2023-01-31: 2750 mL

## 2023-01-31 MED ORDER — ONDANSETRON HCL 4 MG/2ML IJ SOLN: 4.0000 mg | Freq: Four times a day (QID) | INTRAMUSCULAR | Status: DC | PRN

## 2023-01-31 MED ORDER — ACETAMINOPHEN 10 MG/ML IV SOLN: 1000.0000 mg | Freq: Once | INTRAVENOUS | Status: DC | PRN

## 2023-01-31 MED ORDER — ACETAMINOPHEN 325 MG PO TABS: 325.0000 mg | ORAL_TABLET | Freq: Four times a day (QID) | ORAL | Status: DC | PRN

## 2023-01-31 MED ORDER — FENTANYL CITRATE (PF) 100 MCG/2ML IJ SOLN
INTRAMUSCULAR | Status: DC | PRN
  Administered 2023-01-31: 50 ug via INTRAVENOUS

## 2023-01-31 MED ORDER — 0.9 % SODIUM CHLORIDE (POUR BTL) OPTIME
TOPICAL | Status: DC | PRN
  Administered 2023-01-31: 1000 mL

## 2023-01-31 MED ORDER — ONDANSETRON HCL 4 MG/2ML IJ SOLN
INTRAMUSCULAR | Status: DC | PRN
  Administered 2023-01-31: 4 mg via INTRAVENOUS

## 2023-01-31 MED ORDER — PROPOFOL 1000 MG/100ML IV EMUL
INTRAVENOUS | Status: AC
  Filled 2023-01-31: qty 100

## 2023-01-31 MED ORDER — KETOROLAC TROMETHAMINE 30 MG/ML IJ SOLN
INTRAMUSCULAR | Status: DC | PRN
  Administered 2023-01-31: 30 mg via INTRAVENOUS

## 2023-01-31 MED ORDER — DEXAMETHASONE SODIUM PHOSPHATE 10 MG/ML IJ SOLN
INTRAMUSCULAR | Status: DC | PRN
  Administered 2023-01-31: 10 mg via INTRAVENOUS

## 2023-01-31 MED ORDER — DEXMEDETOMIDINE HCL IN NACL 80 MCG/20ML IV SOLN
INTRAVENOUS | Status: AC
  Filled 2023-01-31: qty 20

## 2023-01-31 MED ORDER — BUPIVACAINE LIPOSOME 1.3 % IJ SUSP
INTRAMUSCULAR | Status: DC | PRN
  Administered 2023-01-31: 10 mL

## 2023-01-31 MED ORDER — MIDAZOLAM HCL 2 MG/2ML IJ SOLN
INTRAMUSCULAR | Status: DC | PRN
  Administered 2023-01-31 (×2): 1 mg via INTRAVENOUS

## 2023-01-31 MED ORDER — BUPIVACAINE-EPINEPHRINE (PF) 0.5% -1:200000 IJ SOLN: INTRAMUSCULAR | Status: DC | PRN

## 2023-01-31 MED ORDER — LIDOCAINE-EPINEPHRINE 1 %-1:100000 IJ SOLN
INTRAMUSCULAR | Status: DC | PRN
  Administered 2023-01-31: 20 mL

## 2023-01-31 MED ORDER — ACETAMINOPHEN 10 MG/ML IV SOLN
INTRAVENOUS | Status: AC
  Filled 2023-01-31: qty 100

## 2023-01-31 MED ORDER — FAMOTIDINE 20 MG PO TABS
ORAL_TABLET | ORAL | Status: AC
  Filled 2023-01-31: qty 1

## 2023-01-31 MED ORDER — TRAMADOL HCL 50 MG PO TABS
ORAL_TABLET | ORAL | Status: AC
  Filled 2023-01-31: qty 1

## 2023-01-31 MED ORDER — BUPIVACAINE LIPOSOME 1.3 % IJ SUSP
INTRAMUSCULAR | Status: AC
  Filled 2023-01-31: qty 10

## 2023-01-31 MED ORDER — BUPIVACAINE HCL (PF) 0.5 % IJ SOLN
INTRAMUSCULAR | Status: DC | PRN
  Administered 2023-01-31: 10 mL via PERINEURAL

## 2023-01-31 MED ORDER — FENTANYL CITRATE (PF) 100 MCG/2ML IJ SOLN
25.0000 ug | INTRAMUSCULAR | Status: DC | PRN
  Administered 2023-01-31 (×2): 50 ug via INTRAVENOUS

## 2023-01-31 MED ORDER — PROPOFOL 500 MG/50ML IV EMUL
INTRAVENOUS | Status: DC | PRN
  Administered 2023-01-31: 100 ug/kg/min via INTRAVENOUS

## 2023-01-31 MED ORDER — LIDOCAINE HCL (CARDIAC) PF 100 MG/5ML IV SOSY
PREFILLED_SYRINGE | INTRAVENOUS | Status: DC | PRN
  Administered 2023-01-31: 80 mg via INTRAVENOUS

## 2023-01-31 MED ORDER — ACETAMINOPHEN 10 MG/ML IV SOLN
INTRAVENOUS | Status: DC | PRN
  Administered 2023-01-31: 1000 mg via INTRAVENOUS

## 2023-01-31 MED ORDER — LIDOCAINE HCL (PF) 2 % IJ SOLN
INTRAMUSCULAR | Status: AC
  Filled 2023-01-31: qty 5

## 2023-01-31 MED ORDER — METOCLOPRAMIDE HCL 10 MG PO TABS: 5.0000 mg | ORAL_TABLET | Freq: Three times a day (TID) | ORAL | Status: DC | PRN

## 2023-01-31 MED ORDER — BUPIVACAINE HCL (PF) 0.5 % IJ SOLN
INTRAMUSCULAR | Status: AC
  Filled 2023-01-31: qty 10

## 2023-01-31 MED ORDER — MIDAZOLAM HCL 2 MG/2ML IJ SOLN
2.0000 mg | Freq: Once | INTRAMUSCULAR | Status: AC
  Administered 2023-01-31: 2 mg via INTRAVENOUS

## 2023-01-31 MED ORDER — PHENYLEPHRINE 80 MCG/ML (10ML) SYRINGE FOR IV PUSH (FOR BLOOD PRESSURE SUPPORT)
PREFILLED_SYRINGE | INTRAVENOUS | Status: DC | PRN
  Administered 2023-01-31 (×4): 80 ug via INTRAVENOUS

## 2023-01-31 MED ORDER — DEXMEDETOMIDINE HCL IN NACL 80 MCG/20ML IV SOLN
INTRAVENOUS | Status: DC | PRN
  Administered 2023-01-31 (×3): 8 ug via INTRAVENOUS

## 2023-01-31 MED ORDER — CHLORHEXIDINE GLUCONATE 0.12 % MT SOLN
OROMUCOSAL | Status: AC
  Filled 2023-01-31: qty 15

## 2023-01-31 MED ORDER — DEXAMETHASONE SODIUM PHOSPHATE 10 MG/ML IJ SOLN
INTRAMUSCULAR | Status: AC
  Filled 2023-01-31: qty 1

## 2023-01-31 MED ORDER — BUPIVACAINE HCL (PF) 0.5 % IJ SOLN
INTRAMUSCULAR | Status: AC
  Filled 2023-01-31: qty 30

## 2023-01-31 MED ORDER — CEFAZOLIN SODIUM-DEXTROSE 2-4 GM/100ML-% IV SOLN
INTRAVENOUS | Status: AC
  Filled 2023-01-31: qty 100

## 2023-01-31 MED ORDER — ONDANSETRON HCL 4 MG PO TABS: 4.0000 mg | ORAL_TABLET | Freq: Four times a day (QID) | ORAL | Status: DC | PRN

## 2023-01-31 MED ORDER — SODIUM CHLORIDE 0.9 % IV SOLN: INTRAVENOUS | Status: DC

## 2023-01-31 MED ORDER — ONDANSETRON HCL 4 MG/2ML IJ SOLN
INTRAMUSCULAR | Status: AC
  Filled 2023-01-31: qty 2

## 2023-01-31 MED ORDER — METOCLOPRAMIDE HCL 5 MG/ML IJ SOLN: 5.0000 mg | Freq: Three times a day (TID) | INTRAMUSCULAR | Status: DC | PRN

## 2023-01-31 MED ORDER — BUPIVACAINE HCL 0.5 % IJ SOLN
INTRAMUSCULAR | Status: DC | PRN
  Administered 2023-01-31: 20 mL
  Administered 2023-01-31: 10 mL

## 2023-01-31 MED ORDER — LIDOCAINE-EPINEPHRINE 1 %-1:100000 IJ SOLN
INTRAMUSCULAR | Status: AC
  Filled 2023-01-31: qty 1

## 2023-01-31 MED ORDER — PROPOFOL 10 MG/ML IV BOLUS
INTRAVENOUS | Status: DC | PRN
  Administered 2023-01-31: 150 mg via INTRAVENOUS

## 2023-01-31 SURGICAL SUPPLY — 86 items
ADAPTER IRRIG TUBE 2 SPIKE SOL (ADAPTER) ×1 IMPLANT
ADPR TBG 2 SPK PMP STRL ASCP (ADAPTER)
ANCH SUT 4.5 FTPRNT PEEK-OPTM (Anchor) ×1 IMPLANT
ANCHOR 4.5 FOOTPRINT ULTRA (Anchor) IMPLANT
APL PRP STRL LF DISP 70% ISPRP (MISCELLANEOUS) ×2
BASIN GRAD PLASTIC 32OZ STRL (MISCELLANEOUS) ×1 IMPLANT
BIT DRILL 2.0 (BIT) ×1
BIT DRILL 2XNS DISP SS SM FRAG (BIT) ×1 IMPLANT
BIT DRILL 4X4.5 FOOTPRINT STR (BIT) IMPLANT
BIT DRL 2XNS DISP SS SM FRAG (BIT) ×1
BLADE FULL RADIUS 3.5 (BLADE) ×1 IMPLANT
BLADE OSC/SAGITTAL MD 9X18.5 (BLADE) ×1 IMPLANT
BLADE SURG 15 STRL LF DISP TIS (BLADE) ×2 IMPLANT
BLADE SURG 15 STRL SS (BLADE) ×2
BLADE SURG SZ10 CARB STEEL (BLADE) ×2 IMPLANT
BNDG CMPR 5X6 CHSV STRCH STRL (GAUZE/BANDAGES/DRESSINGS) ×1
BNDG CMPR 5X62 HK CLSR LF (GAUZE/BANDAGES/DRESSINGS) ×1
BNDG CMPR 6"X 5 YARDS HK CLSR (GAUZE/BANDAGES/DRESSINGS) ×1
BNDG COHESIVE 6X5 TAN ST LF (GAUZE/BANDAGES/DRESSINGS) ×1 IMPLANT
BNDG ELASTIC 6INX 5YD STR LF (GAUZE/BANDAGES/DRESSINGS) ×1 IMPLANT
BNDG ESMARCH 6 X 12 STRL LF (GAUZE/BANDAGES/DRESSINGS) ×1
BNDG ESMARCH 6X12 STRL LF (GAUZE/BANDAGES/DRESSINGS) ×1 IMPLANT
BRACE KNEE POST OP SHORT (BRACE) IMPLANT
BUR ACROMIONIZER 4.0 (BURR) IMPLANT
BUR SHAVER STR WIDE 5.5 (BURR) ×1 IMPLANT
BURR SHAVER STR WIDE 5.5 (BURR) ×1
CHLORAPREP W/TINT 26 (MISCELLANEOUS) ×1 IMPLANT
COOLER POLAR GLACIER W/PUMP (MISCELLANEOUS) IMPLANT
COVER BACK TABLE REUSABLE LG (DRAPES) ×1 IMPLANT
CUFF TOURN SGL QUICK 24 (TOURNIQUET CUFF)
CUFF TOURN SGL QUICK 34 (TOURNIQUET CUFF)
CUFF TRNQT CYL 24X4X16.5-23 (TOURNIQUET CUFF) IMPLANT
CUFF TRNQT CYL 34X4.125X (TOURNIQUET CUFF) IMPLANT
DRAPE 3/4 80X56 (DRAPES) ×3 IMPLANT
DRAPE ARTHRO LIMB 89X125 STRL (DRAPES) ×1 IMPLANT
DRAPE IMP U-DRAPE 54X76 (DRAPES) ×1 IMPLANT
DRILL 4X4.5 FOOTPRINT STR (BIT) ×1
DRSG OPSITE POSTOP 3X4 (GAUZE/BANDAGES/DRESSINGS) IMPLANT
DRSG OPSITE POSTOP 4X6 (GAUZE/BANDAGES/DRESSINGS) IMPLANT
GAUZE SPONGE 4X4 12PLY STRL (GAUZE/BANDAGES/DRESSINGS) ×2 IMPLANT
GAUZE SPONGE 4X4 16PLY XRAY LF (GAUZE/BANDAGES/DRESSINGS) IMPLANT
GAUZE XEROFORM 1X8 LF (GAUZE/BANDAGES/DRESSINGS) IMPLANT
GLOVE BIO SURGEON STRL SZ7.5 (GLOVE) ×4 IMPLANT
GLOVE BIO SURGEON STRL SZ8 (GLOVE) ×4 IMPLANT
GLOVE BIOGEL PI IND STRL 8 (GLOVE) ×1 IMPLANT
GLOVE INDICATOR 8.0 STRL GRN (GLOVE) ×1 IMPLANT
GOWN STRL REUS W/ TWL LRG LVL3 (GOWN DISPOSABLE) ×1 IMPLANT
GOWN STRL REUS W/ TWL XL LVL3 (GOWN DISPOSABLE) ×1 IMPLANT
GOWN STRL REUS W/TWL LRG LVL3 (GOWN DISPOSABLE) ×1
GOWN STRL REUS W/TWL XL LVL3 (GOWN DISPOSABLE) ×1
GUIDEWIRE 1.2MMX18 (WIRE) IMPLANT
HANDLE YANKAUER SUCT BULB TIP (MISCELLANEOUS) ×1 IMPLANT
IV LACTATED RINGER IRRG 3000ML (IV SOLUTION) ×2
IV LR IRRIG 3000ML ARTHROMATIC (IV SOLUTION) ×4 IMPLANT
KIT GUIDE PIN AND WIRE PACK (WIRE) IMPLANT
KIT TURNOVER KIT A (KITS) ×1 IMPLANT
MANIFOLD NEPTUNE II (INSTRUMENTS) ×2 IMPLANT
MAT ABSORB FLUID 56X50 GRAY (MISCELLANEOUS) ×2 IMPLANT
NDL HYPO 21X1.5 SAFETY (NEEDLE) ×1 IMPLANT
NEEDLE HYPO 21X1.5 SAFETY (NEEDLE) ×1 IMPLANT
NS IRRIG 500ML POUR BTL (IV SOLUTION) IMPLANT
PACK ARTHROSCOPY KNEE (MISCELLANEOUS) ×1 IMPLANT
PAD ABD DERMACEA PRESS 5X9 (GAUZE/BANDAGES/DRESSINGS) IMPLANT
PAD WRAPON POLAR KNEE (MISCELLANEOUS) ×1 IMPLANT
PENCIL SMOKE EVACUATOR (MISCELLANEOUS) ×1 IMPLANT
PUSHER KNOT ARTHRO 360DEG (MISCELLANEOUS) IMPLANT
SCREW SOFTSILK 1.5 7X25 (Screw) IMPLANT
SCREW SOFTSILK 1.5 9X25 (Screw) IMPLANT
SLEEVE REMOTE CONTROL 5X12 (DRAPES) IMPLANT
SPONGE T-LAP 18X18 ~~LOC~~+RFID (SPONGE) ×2 IMPLANT
STAPLER SKIN PROX 35W (STAPLE) ×1 IMPLANT
STRAP SAFETY 5IN WIDE (MISCELLANEOUS) ×1 IMPLANT
SUT FIBERWIRE #2 38 BLUE 1/2 (SUTURE) ×1
SUT VIC AB 0 CT1 36 (SUTURE) ×1 IMPLANT
SUT VIC AB 2-0 CT1 27 (SUTURE) ×4
SUT VIC AB 2-0 CT1 TAPERPNT 27 (SUTURE) ×3 IMPLANT
SUTURE FIBERWR #2 38 BLUE 1/2 (SUTURE) ×4 IMPLANT
SYR 10ML LL (SYRINGE) IMPLANT
SYR 50ML LL SCALE MARK (SYRINGE) ×1 IMPLANT
SYR BULB IRRIG 60ML STRL (SYRINGE) ×1 IMPLANT
TOWEL OR 17X26 4PK STRL BLUE (TOWEL DISPOSABLE) ×3 IMPLANT
TRAP FLUID SMOKE EVACUATOR (MISCELLANEOUS) ×1 IMPLANT
TUBE SET DOUBLEFLO INFLOW (TUBING) ×1 IMPLANT
WAND WEREWOLF FLOW 90D (MISCELLANEOUS) ×1 IMPLANT
WATER STERILE IRR 500ML POUR (IV SOLUTION) ×1 IMPLANT
WRAPON POLAR PAD KNEE (MISCELLANEOUS) ×1

## 2023-01-31 NOTE — Anesthesia Procedure Notes (Signed)
Anesthesia Regional Block: Adductor canal block   Pre-Anesthetic Checklist: , timeout performed,  Correct Patient, Correct Site, Correct Laterality,  Correct Procedure, Correct Position, site marked,  Risks and benefits discussed,  Surgical consent,  Pre-op evaluation,  At surgeon's request and post-op pain management  Laterality: Lower and Left  Prep: chloraprep       Needles:  Injection technique: Single-shot  Needle Type: Echogenic Needle     Needle Length: 9cm  Needle Gauge: 21     Additional Needles:   Procedures:,,,, ultrasound used (permanent image in chart),,    Narrative:  Injection made incrementally with aspirations every 5 mL.  Performed by: Personally  Anesthesiologist: Corinda Gubler, MD  Additional Notes: Patient's chart reviewed and they were deemed appropriate candidate for procedure, per surgeon's request. Patient educated about risks, benefits, and alternatives of the block including but not limited to: temporary or permanent nerve damage, bleeding, infection, damage to surround tissues, block failure, local anesthetic toxicity. Patient expressed understanding. A formal time-out was conducted consistent with institution rules.  Monitors were applied, and minimal sedation used (see nursing record). The site was prepped with skin prep and allowed to dry, and sterile gloves were used. A high frequency linear ultrasound probe with probe cover was utilized throughout. Femoral artery visualized at mid-thigh level, local anesthetic injected anterolateral to it, and echogenic block needle trajectory was monitored throughout. Hydrodissection of saphenous nerve visualized and appeared anatomically normal. Aspiration performed every 5ml. Blood vessels were avoided. All injections were performed without resistance and free of blood and paresthesias. The patient tolerated the procedure well.  Injectate: 10ml exparel + 10ml 0.5% bupivacaine

## 2023-01-31 NOTE — H&P (Signed)
History of Present Illness: Erin Hull is a 42 y.o.female who is being referred by Dr. Reita May for left knee pain and instability. The symptoms began about a year ago and developed after she felt a "pop" in her knee while twisting. She did not seek treatment immediately, but because of continued pain, she saw her primary care provider. She was sent for an MRI scan then referred to me for further evaluation and treatment. The patient does recall undergoing 2 prior knee arthroscopies in 2002 and 2007 respectively. She recalls the first scope was primarily due to a symptomatic plica whereas the second was due to a torn medial meniscus. 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 She reports 5/10 pain in the left knee on today's visit. The pain is located along the medial and posterior aspect of the knee. The pain is described as aching, dull, stabbing, and throbbing. The symptoms are aggravated at higher levels of activity, walking, standing, standing pivot, and exercising. She also notes pain at night which occasionally will awaken her from sleep. She also describes occasional episodes of giving way and catching. She has no associated swelling and no deformity. She has tried acetaminophen, over-the-counter medications, anti-inflammatories, steroid injections, physical therapy, a home exercise program, ice, and heat, all with limited benefit.  Current Outpatient Medications:  acetaminophen (TYLENOL) 325 MG tablet Take by mouth Take 650 mg by mouth every 6 (six) hours as needed.  albuterol MDI, PROVENTIL, VENTOLIN, PROAIR, HFA 90 mcg/actuation inhaler Inhale 2 inhalations into the lungs as needed  galcanezumab-gnlm 120 mg/mL PnIj Inject 120 mg subcutaneously every 28 (twenty-eight) days Start 1 month after loading dose 1 mL 11  ibuprofen (MOTRIN)  200 MG tablet Take by mouth at bedtime as needed  rimegepant (NURTEC ODT) 75 mg disintegrating tablet Take 1 tablet (75 mg total) by mouth as directed 16 tablet 11  WEGOVY 1.7 mg/0.75 mL pen injector Inject 1.7 mg subcutaneously once a week   Allergies:  Lortab [Hydrocodone-Acetaminophen] Rash and Hives  Percocet [Oxycodone-Acetaminophen] Rash and Hives   Past Medical History:  Arthritis  Crohn's disease (CMS/HHS-HCC)  Depression  GERD (gastroesophageal reflux disease)  Migraine headache   Past Surgical History:  LAPAROSCOPIC REMOVAL ECTOPIC PREGNANCY 06/10/2018 (left side)  Left knee arthoscopy x2  Tonsilectomy   Family History:  Gout Mother  Diabetes Father  Heart disease Father  COPD Father  Coronary Artery Disease (Blocked arteries around heart) Father  Myocardial Infarction (Heart attack) Father  High blood pressure (Hypertension) Father  Stroke Father  Tremor Father  Diabetes Brother  Myocardial Infarction (Heart attack) Maternal Grandmother  Aneurysm Paternal Grandmother  Brain hemorrhage Paternal Grandmother   Social History:   Socioeconomic History:  Marital status: Single  Tobacco Use  Smoking status: Never  Smokeless tobacco: Never  Vaping Use  Vaping status: Never Used  Substance and Sexual Activity  Alcohol use: No  Drug use: No  Sexual activity: Yes   Review of Systems:  A comprehensive 14 point ROS was performed, reviewed, and the pertinent orthopaedic findings are documented in the HPI.  Physical Exam: Vitals:  01/16/23 0821  BP: 116/74  Weight: 77.7 kg (171 lb 6.4 oz)  Height: 170.2 cm (5\' 7" )  PainSc: 5  PainLoc: Knee   General/Constitutional: The patient appears to be well-nourished, well-developed, and  in no acute distress. Neuro/Psych: Normal mood and affect, oriented to person, place and time. Eyes: Non-icteric. Pupils are equal, round, and reactive to light, and exhibit synchronous movement. Lymphatic: No palpable  adenopathy. Respiratory: Lungs clear to auscultation, Normal chest excursion, No wheezes, and Non-labored breathing Cardiovascular: Regular rate and rhythm. No murmurs. and No edema, swelling or tenderness, except as noted in detailed exam. Vascular: No edema, swelling or tenderness, except as noted in detailed exam. Integumentary: No impressive skin lesions present, except as noted in detailed exam. Musculoskeletal: Unremarkable, except as noted in detailed exam.  Left knee exam: GAIT: mild limp and uses no assistive devices. ALIGNMENT: normal SKIN: unremarkable SWELLING: minimal EFFUSION: trace WARMTH: no warmth TENDERNESS: mild over the medial joint line ROM: 0 to 120 degrees with pain in maximal flexion McMURRAY'S: equivocal PATELLOFEMORAL: normal tracking with no peri-patellar tenderness and negative apprehension sign CREPITUS: no LACHMAN'S: 1+ PIVOT SHIFT: trace positive ANTERIOR DRAWER: trace positive POSTERIOR DRAWER: negative VARUS/VALGUS: stable  She is neurovascularly intact to the left lower extremity and foot.  Knee Imaging, external: Left knee: A recent MRI scan of the left knee is available for review and has been reviewed by myself. This study demonstrates the presence of a chronically torn anterior cruciate ligament, as well as signal changes in the posterior cruciate ligament, consistent with an old prior at least partial tear of the previous posterior cruciate ligament. The medial and lateral menisci appear to be in satisfactory condition, as do the articular surfaces of the femur, tibia, and patella. No acute bony abnormalities are identified. Both the films and report were reviewed by myself and discussed with the patient.  Assessment:  1. 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   Plan: The treatment options were discussed with the patient. In addition, patient educational materials were provided regarding the diagnosis and treatment options. The patient is quite  frustrated by her symptoms and functional limitations, and is ready to consider more aggressive treatment options. Therefore, I have recommended a surgical procedure, specifically a left knee arthroscopy with debridement and arthroscopically assisted ACL reconstruction using bone patella tendon bone autograft. The procedure was discussed with the patient, as were the potential risks (including bleeding, infection, nerve and/or blood vessel injury, persistent or recurrent pain, failure of the repair, progression of arthritis, need for further surgery, blood clots, strokes, heart attacks and/or arhythmias, pneumonia, etc.) and benefits. The patient states her understanding and wishes to proceed. All of the patient's questions and concerns were answered. She can call any time with further concerns. She will return to work without restrictions. She will follow up post-surgery, routine.    H&P reviewed and patient re-examined. No changes.

## 2023-01-31 NOTE — Op Note (Addendum)
01/31/2023  1:49 PM  Patient:   Erin Hull  Pre-Op Diagnosis:   Chronic anterior cruciate ligament tear, left knee.  Post-Op Diagnosis:   Same same with partial-thickness tear of the posterior cruciate ligament, left knee.  Procedure:   Arthroscopically-assisted anterior cruciate ligament reconstruction using bone-patella tendon-bone autograft and lateral extra-articular tenodesis, thermoplasty of PCL, left knee.  Surgeon:   Maryagnes Amos, MD  Assistant:   Horris Latino, PA-C  Anesthesia:   General laryngeal mask anesthesia with adductor canal block placed preoperatively by the anesthesiologist.  Findings:   As above. There was moderate attenuation of the posterior cruciate ligament fibers, as well as a complete tear of the anterior cruciate ligament. The medial and lateral menisci both were in excellent condition. There were grade I-II chondromalacia changes involving the medial most portion of the lateral tibial plateau. Otherwise, the articular surfaces of the patella, femur, and tibia all were in excellent condition.  Complications:   None  EBL:   10 cc  Fluids:   800 cc crystalloid  TT:   114 minutes at 300 mmHg  Drains:   None  Closure:   Staples  Implants:   BioSilk interference screws 2, 4.5 mm ultra PK suture anchor  Brief Clinical Note:   The patient is a 42 year old female with a 1+ year history of increased pain and instability of her left knee following a twisting injury her symptoms have worsened despite medications, activity modification, etc. Her history examination consistent with a chronic ACL tear confirmed by MRI scan. The MRI scan also noted signal changes in the PCL consistent with an old partial injury to her posterior cruciate ligament. The patient does recall falling onto a flexed knee many years ago and wonders if this may have been the cause of this injury. The patient presents at this time for arthroscopy, debridement, and reconstruction of the  anterior cruciate ligament using bone-patella tendon-bone autograft.  Procedure:   The patient underwent placement of an adductor canal block in the preoperative holding area before being brought into the operating room and lain in the supine position. After adequate general laryngal mask anesthesia was obtained, a timeout was performed to verify the appropriate surgical site and side. An examination under anesthesia demonstrated a 2+ Lachman and a 1+ pivot shift. The knee was injected sterilely using a solution of 20 cc of 0.5% Sensorcaine with epinephrine and 20 cc of 1% lidocaine. The left lower extremity was prepped with ChloraPrep solution before being draped sterilely. Preoperative antibiotics were administered. The limb was exsanguinated with an Esmarch and the tourniquet inflated to 300 mmHg. A solution of 10 cc of Exparel and 10 cc of 0.5% Sensorcaine with epinephrine was injected into the expected incision sites and portal sites.   An incision was made over the anterior aspect of the knee beginning at the inferior pole of the patella and extending distally and slightly medially to the tibial tubercle. The incision was carried down through the subcutaneous cutaneous tissues to expose the superficial retinaculum. This was split the length of the incision and the medial and lateral flaps elevated sufficiently to expose the medial and lateral borders of the patellar tendon. A Kelly clamp was passed beneath the patella tendon and the tendon width measured and found to be 32 mm. A 10 mm graft was marked and harvested using a #10 blade. Contiguous bone plugs from both the patella and the tibial tubercle were harvested using an oscillating saw and osteotomes. Two drill holes  were placed into each bone plug prior to removing it from the field. The graft was taken to the back table where it was prepared for later reimplantation. Meanwhile, the superficial fibers of the patellar tendon defect were loosely  reapproximated using 2-0 Vicryl interrupted sutures.  The arthroscopic portion of the procedure was begun. Subcutaneous anteromedial and anterolateral portals were created before the camera was placed in the anterolateral portal and instrumentation performed through the anteromedial portal. The knee was sequentially examined beginning in the suprapatellar pouch, then progressing to the patellofemoral space, the medial gutter and compartment, the notch, and finally the lateral compartment and gutter. Abundant reactive synovial tissues anteriorly were debrided in order to improve visualization. The findings were as described above. The medial and lateral menisci were carefully probed and found to be intact. The posterior cruciate ligament was noted to be somewhat lax so it was elected to perform a thermoplasty of the remaining tissues to try to tighten the PCL. This was performed using the ArthroCare wand set at a low setting.  Attention was redirected to the notch. The remnants of the anterior cruciate ligament were debrided from the femoral and tibial sides before a notchplasty was performed. Care was taken to be sure the over-the-top position was identified using a Therapist, nutritional. The tibial guide was positioned and the guidewire drilled up through the proximal tibia into the notch. After verifying its position, it was overreamed with an 11 mm straight reamer. The bony debris was removed using the full-radius resector. Through the tibial tunnel, a 7 mm over-the-top guide was positioned at approximately the 2:00 position. With the knee flexed to 90, a guide wire was drilled up into the distal femur. The 10 mm acorn reamer was placed over the guidewire. A footprint was made and assessed to be sure that the femoral socket would not blow out the back of the lateral femoral condyle before the acorn reamer was advanced to the appropriate depth to accommodate the femoral bone plug. Again the bony debris was removed  using the full-radius resector.   With the knee flexed beyond 90, the Two-pin passer was passed up through the tibial tunnel into the femoral socket, then advanced so that it exited through the anterolateral aspect of the distal thigh. A Hyperflex wire was passed through the anteromedial portal and advanced so that it engaged the groove of the Two-pin passer before it too was advanced approximately to exit from the antero-lateral thigh. The sutures that had been placed through the femoral plug were passed through the eye of the Two-pin passer before the Two-pin passer was advanced proximally, pulling the graft through the tibial tunnel into the femoral socket. Once it was seated securely, the femoral plug was secured using a 7 x 25 mm BioSilk screw. Distal traction demonstrated excellent fixation. The knee was brought into extension to be sure that there would be no impingement upon the roof the notch. The tibial plug was secured using a 9 x 25 mm BioSilk screw with a posterior drawer force applied to the knee. Once the graft was secured, a gentle Lachman maneuver demonstrated excellent stability with less than 2 mm of anterior displacement. A gentle pivot shift was negative.  It was noted that the patient had approximate 10 degrees of hyperextension of her knee. Given this hyperextension as well as the posterior cruciate ligament insufficiency, it was felt best to proceed with a lateral extra-articular tenodesis to supplement the ACL reconstruction. An approximately 6 to 7 cm incision  was made over the lateral aspect of the knee centered over the IT band. This incision was carried down through the subcutaneous tissues to expose the iliotibial band. A 1 x 10 cm strip of iliotibial band was harvested, leaving the distal attachment intact. A #2 FiberWire was whipstitched through the free end of this graft before it was passed beneath the proximal end of the fibular collateral ligament and an anterior to posterior  direction then attached to the lateral femur just proximal and posterior to the lateral epicondyles utilizing a [employer] 4.5 mm Ultra PK suture anchor. The graft was tightened at approximately 20 degrees of knee extension.  The scope was reintroduced to be sure that tibial screw did not enter the joint before the instruments removed from the joint after suctioning the excess fluid. Bone graft material that had been harvested from the tibial tunnel reamings were packed into the patella and tibial tubercle defects before the superficial retinacular layer was reapproximated using 2-0 Vicryl interrupted sutures. The subcutaneous tissues were closed in two layers using 2-0 Vicryl interrupted sutures before the skin was closed using staples. The lateral incision was closed using #0 Vicryl interrupted sutures to close the defect and the IT band before the subcutaneous tissues were closed in 2 layers using 2-0 Vicryl interrupted sutures. Again, the skin was closed using staples. A sterile bulky dressing was applied to the knee, incorporating a Polar Care pad, before the patient was placed into a hinged knee brace with the hinges set at 0-90 but locked in extension. The patient was then awakened, extubated, and returned to the recovery room in satisfactory condition after tolerating the procedure well.

## 2023-01-31 NOTE — Anesthesia Preprocedure Evaluation (Signed)
Anesthesia Evaluation  Patient identified by MRN, date of birth, ID band Patient awake    Reviewed: Allergy & Precautions, NPO status , Patient's Chart, lab work & pertinent test results  History of Anesthesia Complications (+) PONV and history of anesthetic complications  Airway Mallampati: II  TM Distance: >3 FB Neck ROM: Full    Dental no notable dental hx. (+) Teeth Intact   Pulmonary neg pulmonary ROS, neg sleep apnea, neg COPD, Patient abstained from smoking.Not current smoker   Pulmonary exam normal breath sounds clear to auscultation       Cardiovascular Exercise Tolerance: Good METS(-) hypertension(-) CAD and (-) Past MI (-) dysrhythmias  Rhythm:Regular Rate:Normal - Systolic murmurs    Neuro/Psych  Headaches PSYCHIATRIC DISORDERS Anxiety        GI/Hepatic ,GERD  Controlled,,(+)     (-) substance abuse    Endo/Other  neg diabetes  On GLP1 , last taken 10 days ago. Denies GI symptoms today  Renal/GU negative Renal ROS     Musculoskeletal   Abdominal   Peds  Hematology   Anesthesia Other Findings Past Medical History: No date: Allergy     Comment:  Seasonal No date: Anxiety No date: Arthritis No date: Complication of anesthesia No date: Crohn disease (HCC) 05/2018: Ectopic pregnancy No date: GERD (gastroesophageal reflux disease) No date: PONV (postoperative nausea and vomiting)  Reproductive/Obstetrics                             Anesthesia Physical Anesthesia Plan  ASA: 2  Anesthesia Plan: General   Post-op Pain Management: Regional block*, Ofirmev IV (intra-op)* and Toradol IV (intra-op)*   Induction: Intravenous  PONV Risk Score and Plan: 4 or greater and Ondansetron, Dexamethasone, Midazolam, TIVA and Propofol infusion  Airway Management Planned: Oral ETT and Video Laryngoscope Planned  Additional Equipment: None  Intra-op Plan:   Post-operative Plan:  Extubation in OR  Informed Consent: I have reviewed the patients History and Physical, chart, labs and discussed the procedure including the risks, benefits and alternatives for the proposed anesthesia with the patient or authorized representative who has indicated his/her understanding and acceptance.     Dental advisory given  Plan Discussed with: CRNA and Surgeon  Anesthesia Plan Comments: (Discussed risks of anesthesia with patient, including PONV, sore throat, lip/dental/eye damage. Rare risks discussed as well, such as cardiorespiratory and neurological sequelae, and allergic reactions. Discussed the role of CRNA in patient's perioperative care. Patient understands.  Discussed r/b/a of adductor canal nerve block, including:  - bleeding, infection, nerve damage - poor or non functioning block. - reactions and toxicity to local anesthetic Patient understands. )       Anesthesia Quick Evaluation

## 2023-01-31 NOTE — Discharge Instructions (Addendum)
Orthopedic discharge instructions: May shower with intact OpSite dressing beginning on Monday, so long as able to sit in the shower.  Apply ice frequently to knee or use Polar Care. Take ibuprofen 600-800 mg TID with meals for 7 days, then as necessary. Take tramadol as prescribed when needed.  May supplement with ES Tylenol if necessary. May weight-bear as tolerated so long as in brace locked in extension - use crutches as needed. Follow-up in 10-14 days or as scheduled.  AMBULATORY SURGERY  DISCHARGE INSTRUCTIONS   The drugs that you were given will stay in your system until tomorrow so for the next 24 hours you should not:  Drive an automobile Make any legal decisions Drink any alcoholic beverage   You may resume regular meals tomorrow.  Today it is better to start with liquids and gradually work up to solid foods.  You may eat anything you prefer, but it is better to start with liquids, then soup and crackers, and gradually work up to solid foods.   Please notify your doctor immediately if you have any unusual bleeding, trouble breathing, redness and pain at the surgery site, drainage, fever, or pain not relieved by medication.    Additional Instructions:        Please contact your physician with any problems or Same Day Surgery at [phone], Monday through Friday 6 am to 4 pm, or [place] at [facility] number at [phone].  POLAR CARE INFORMATION  MassAdvertisement.it  How to use [employer] Polar Care [facility] Therapy System?  YouTube   ShippingScam.co.uk  OPERATING INSTRUCTIONS  Start the product With dry hands, connect the transformer to the electrical connection located on the top of the cooler. Next, plug the transformer into an appropriate electrical outlet. The unit will automatically start running at this point.  To stop the pump, disconnect electrical power.  Unplug to stop the product when not in use. Unplugging the Polar  Care unit turns it off. Always unplug immediately after use. Never leave it plugged in while unattended. Remove pad.    FIRST ADD WATER TO FILL LINE, THEN ICE---Replace ice when existing ice is almost melted  1 Discuss Treatment with your Licensed Health Care Practitioner and Use Only as Prescribed 2 Apply Insulation Barrier & Cold Therapy Pad 3 Check for Moisture 4 Inspect Skin Regularly  Tips and Trouble Shooting Usage Tips 1. Use cubed or chunked ice for optimal performance. 2. It is recommended to drain the Pad between uses. To drain the pad, hold the Pad upright with the hose pointed toward the ground. Depress the black plunger and allow water to drain out. 3. You may disconnect the Pad from the unit without removing the pad from the affected area by depressing the silver tabs on the hose coupling and gently pulling the hoses apart. The Pad and unit will seal itself and will not leak. Note: Some dripping during release is normal. 4. DO NOT RUN PUMP WITHOUT WATER! The pump in this unit is designed to run with water. Running the unit without water will cause permanent damage to the pump. 5. Unplug unit before removing lid.  TROUBLESHOOTING GUIDE Pump not running, Water not flowing to the pad, Pad is not getting cold 1. Make sure the transformer is plugged into the wall outlet. 2. Confirm that the ice and water are filled to the indicated levels. 3. Make sure there are no kinks in the pad. 4. Gently pull on the blue tube to make sure the tube/pad  junction is straight. 5. Remove the pad from the treatment site and ll it while the pad is lying at; then reapply. 6. Confirm that the pad couplings are securely attached to the unit. Listen for the double clicks (Figure 1) to confirm the pad couplings are securely attached.  Leaks    Note: Some condensation on the lines, controller, and pads is unavoidable, especially in warmer climates. 1. If using a [employer] Polar Care Cold Therapy unit with a  detachable Cold Therapy Pad, and a leak exists (other than condensation on the lines) disconnect the pad couplings. Make sure the silver tabs on the couplings are depressed before reconnecting the pad to the pump hose; then confirm both sides of the coupling are properly clicked in. 2. If the coupling continues to leak or a leak is detected in the pad itself, stop using it and call [employer] Customer Care at [phone].  Cleaning After use, empty and dry the unit with a soft cloth. Warm water and mild detergent may be used occasionally to clean the pump and tubes.  WARNING: The Polar Care Cube can be cold enough to cause serious injury, including full skin necrosis. Follow these Operating Instructions, and carefully read the Product Insert (see pouch on side of unit) and the Cold Therapy Pad Fitting Instructions (provided with each Cold Therapy Pad) prior to use.       Peripheral Nerve Block (Lower Extremity) Discharge Instructions    For your surgery you have received a femoral Nerve Block.  Your Nerve Block is expected to last for about 4 to 12 hours.  This is an estimated time frame; the results of your nerve block may wear off sooner or may last longer.  If needed, your surgeon will give you a prescription for pain medication.  It will take about 60 minutes for the oral pain medication to become fully effective.  So, it is recommended that you start taking this medication before the nerve block first begins to wear off, or when you first begin to feel discomfort.  Keep in mind that nerve blocks often wear off in the middle of the night.   If you are going to bed and the block has not started to wear off or you have not started to have any discomfort, consider setting an alarm for 2 to 3 hours, so you can assess your block.  If you notice the block is wearing off or you are starting to have discomfort, you can take your pain medication.  Take your pain medication only as prescribed.  Pain  medication can cause sedation and decrease your breathing if you take more than you need for the level of pain that you have.  Nausea is a common side effect of many pain medications.  You may want to eat something before taking your pain medicine to prevent nausea.  After a Peripheral Nerve block, you cannot feel pain, pressure or extremes in temperature in the effected leg.  Because your leg is numb it is at an increased risk for injury.  To decrease the possibility of injury, please practice the following:  While you are awake change the position of your leg frequently to prevent too much pressure on any one area for prolonged periods of time.   If you have a cast or tight dressing, check the color or your toes every couple of hours.  Call your surgeon with the appearance of any discoloration (white or blue).  You may have difficulty bearing  weight on the effected leg.  Have someone assist you with walking until the nerve block has completely worn off.   If you surgeon prescribed a brace to be worn after surgery, DO NOT GET UP AT NIGHT WITHOUT YOUR BRACE.  If your surgeon has restricted the amount of weight you should bear on the effected leg, i.e. No Weight, Partial Weight, or Touch Down Only, DO NOT BEAR MORE WEIGHT THAN INSTRUCTED.   If you experience any problems or concerns, please contact your surgeon.

## 2023-01-31 NOTE — Anesthesia Procedure Notes (Signed)
Procedure Name: LMA Insertion Date/Time: 01/31/2023 11:27 AM  Performed by: Omer Jack, CRNAPre-anesthesia Checklist: Patient identified, Patient being monitored, Timeout performed, Emergency Drugs available and Suction available Patient Re-evaluated:Patient Re-evaluated prior to induction Oxygen Delivery Method: Circle system utilized Preoxygenation: Pre-oxygenation with 100% oxygen Induction Type: IV induction Ventilation: Mask ventilation without difficulty LMA: LMA inserted LMA Size: 4.0 Tube type: Oral Number of attempts: 1 Placement Confirmation: positive ETCO2 and breath sounds checked- equal and bilateral Tube secured with: Tape Dental Injury: Teeth and Oropharynx as per pre-operative assessment

## 2023-01-31 NOTE — Anesthesia Postprocedure Evaluation (Signed)
Anesthesia Post Note  Patient: Erin Hull  Procedure(s) Performed: KNEE ARTHROSCOPY WITH ANTERIOR CRUCIATE LIGAMENT (ACL) REPAIR (Left: Knee)  Patient location during evaluation: PACU Anesthesia Type: General Level of consciousness: awake and alert Pain management: pain level controlled Vital Signs Assessment: post-procedure vital signs reviewed and stable Respiratory status: spontaneous breathing, nonlabored ventilation, respiratory function stable and patient connected to nasal cannula oxygen Cardiovascular status: blood pressure returned to baseline and stable Postop Assessment: no apparent nausea or vomiting Anesthetic complications: no  No notable events documented.   Last Vitals:  Vitals:   01/31/23 1430 01/31/23 1435  BP: 94/63   Pulse: 73 73  Resp: 18 (!) 27  Temp:    SpO2: 100% 98%    Last Pain:  Vitals:   01/31/23 1435  TempSrc:   PainSc: 5                  Stephanie Coup

## 2023-01-31 NOTE — Transfer of Care (Signed)
Immediate Anesthesia Transfer of Care Note  Patient: Erin Hull  Procedure(s) Performed: KNEE ARTHROSCOPY WITH ANTERIOR CRUCIATE LIGAMENT (ACL) REPAIR (Left: Knee)  Patient Location: PACU  Anesthesia Type:General  Level of Consciousness: drowsy and patient cooperative  Airway & Oxygen Therapy: Patient Spontanous Breathing  Post-op Assessment: Report given to RN and Post -op Vital signs reviewed and stable  Post vital signs: Reviewed and stable  Last Vitals:  Vitals Value Taken Time  BP 90/56 01/31/23 1406  Temp 36.4 C 01/31/23 1406  Pulse 77 01/31/23 1408  Resp 21 01/31/23 1408  SpO2 99 % 01/31/23 1408  Vitals shown include unvalidated device data.  Last Pain:  Vitals:   01/31/23 0846  TempSrc: Oral  PainSc: 0-No pain         Complications: No notable events documented.

## 2023-02-01 ENCOUNTER — Encounter: Payer: Self-pay | Admitting: Surgery

## 2023-02-13 ENCOUNTER — Other Ambulatory Visit: Payer: Self-pay

## 2023-03-17 ENCOUNTER — Other Ambulatory Visit: Payer: Self-pay

## 2023-03-18 ENCOUNTER — Other Ambulatory Visit: Payer: Self-pay

## 2023-03-18 MED ORDER — NURTEC 75 MG PO TBDP
75.0000 mg | ORAL_TABLET | ORAL | 11 refills | Status: AC
  Filled 2023-03-18: qty 16, 30d supply, fill #0
  Filled 2023-05-14: qty 16, 30d supply, fill #1
  Filled 2023-07-13: qty 16, 30d supply, fill #2
  Filled 2023-08-10 – 2023-08-12 (×2): qty 16, 30d supply, fill #3
  Filled 2023-09-12: qty 16, 30d supply, fill #4
  Filled 2023-10-19: qty 16, 30d supply, fill #5
  Filled 2023-11-17: qty 16, 30d supply, fill #6
  Filled 2023-12-16: qty 16, 30d supply, fill #7
  Filled 2024-03-05: qty 16, 30d supply, fill #8

## 2023-04-16 ENCOUNTER — Other Ambulatory Visit: Payer: Self-pay | Admitting: Medical Genetics

## 2023-04-16 DIAGNOSIS — Z006 Encounter for examination for normal comparison and control in clinical research program: Secondary | ICD-10-CM

## 2023-04-18 ENCOUNTER — Other Ambulatory Visit: Payer: Self-pay

## 2023-07-30 DIAGNOSIS — M23612 Other spontaneous disruption of anterior cruciate ligament of left knee: Secondary | ICD-10-CM | POA: Diagnosis not present

## 2023-08-12 ENCOUNTER — Other Ambulatory Visit (HOSPITAL_BASED_OUTPATIENT_CLINIC_OR_DEPARTMENT_OTHER): Payer: Self-pay

## 2023-08-12 ENCOUNTER — Other Ambulatory Visit: Payer: Self-pay

## 2023-08-15 ENCOUNTER — Other Ambulatory Visit: Payer: Self-pay

## 2023-08-15 ENCOUNTER — Other Ambulatory Visit (HOSPITAL_COMMUNITY): Payer: Self-pay

## 2023-08-20 ENCOUNTER — Other Ambulatory Visit: Payer: Self-pay

## 2023-08-23 ENCOUNTER — Other Ambulatory Visit (HOSPITAL_COMMUNITY): Payer: Self-pay

## 2023-08-23 ENCOUNTER — Other Ambulatory Visit: Payer: Self-pay

## 2023-09-04 ENCOUNTER — Other Ambulatory Visit
Admission: RE | Admit: 2023-09-04 | Discharge: 2023-09-04 | Disposition: A | Discharge status: Home / Self Care | Payer: Self-pay | Source: Ambulatory Visit | Attending: Medical Genetics | Admitting: Medical Genetics

## 2023-09-04 DIAGNOSIS — Z006 Encounter for examination for normal comparison and control in clinical research program: Secondary | ICD-10-CM | POA: Insufficient documentation

## 2023-09-09 ENCOUNTER — Other Ambulatory Visit (HOSPITAL_COMMUNITY): Payer: Self-pay

## 2023-09-12 ENCOUNTER — Other Ambulatory Visit: Payer: Self-pay

## 2023-09-13 NOTE — Progress Notes (Signed)
 [facility]                    PT Daily Progress Note  Patient's Name:Erin Hull                        MRN #[id number]      Date:09/13/2023  Visit Number: 25  Progress Related to Long and Short Term Goals:                                                                                            [] AROM:  [] PROM:    [] Strength:  [] Pain:    while- [] at rest    [] During activity:  []  Functional goal:    []  Other:    []  Balance:     Patient / Family Education:    []  Progressed HEP to include:  []  Reviewed previous HEP:   ___________________________________________________________________________________   Skilled Service Provided:  To: []  right   [x]  left     []  bilateral    []   core/body stability and balance  []  neck   []   shoulder   []  upper arm/  []  elbow   []  lower arm  []  wrist   []  hand   []   fingers  []  back     []  hip    []  upper leg    [x]  knee  []  lower leg   []   Ankle & Foot           Ther Ex Therapeutic Procedure CPT 97110 : 55 minutes   Manual Tx     Vaso-pneumatic compression      Ice / Heat                        Infrared         US                 [x]  Inc ROM [] Inc joint mob [] Dec swelling [] Dec pain Setting:   [] Inc Circ  [x]  Inc Flex []  Inc Flex [] Dec inflam [] Dec inflam [] Inc Healing [] Dec pain  []  Inc  Bal []  Inc ROM [] Dec edema [] Dec swelling [] Dec pain [] Dec swelling  [x]  Inc Strength [] Astym Level      []  1        []  2      []  3 [] Inc circulation [] Inc circulation [] Dec inflam  []  Progressive  HEP           [] tension/ spasms/soft tissue mob Temp:         36 F [] Inc soft tissue mobility    []  Inc Posture [] Correct malaign       Subjective:  No new complaints regarding knee today.   Treatment notes:  Assessment of Treatment:  Good tolerance for all therex today. Increased reps on multi-hip and knee ext and HS curls.  Patient's Response to Treatment:  [] Dec pain  [] Inc pain    []  Inc flexibility    [x] Inc endurance    [x]  Inc strength     []  Inc AROM      []   Inc PROM       []  Dec Spasms    [] Inc Posture / alignment     []  Inc Joint Mobility   []  Other:   Functional Improvement Noted:  Increased ability to  []  walk   []  stand for longer periods   []  dress with less help    []  lift    []   reach   []   bend  []  Other:  Remaining Impairment Requiring Continued Treatment:   [x]  Dec  flexibility   [x]  Dec  ROM   [x]   pain   [x]  Dec strength  [x]   weakness    [x]   Dec balance   [x]   swelling   [x]   inflammation   []   Spasm    []  posture/alignment    []   Other:  Plan: [x]   Continue Plan of Care        []   Add:         []   Discharge after next visit    []   Discharge to HEP   []  Other: Hip/Knee Treatment Log   Date   12/11 12/18 12/23  12/31 08/28/23 1/22 1/31  Bike(seat____) L4  10' L4  10' L4  10' L4  10' L4 10' L4 10' L4 10'  Stepper (seat     )         Elliptical          LP Plyo jumps         LP  Squats 66 3x10 71 3x10 71 3x10 76 3x10 76 3x10 76 3x10 76 3x10  LP  Single Leg 32 3x10 44 3x10 44 3x10 44 3x10 44 3x10 44 3x10 44 3x10  LP calf raises 66 3x10 71 3x10 71 3x10 76 3x10 76 3x10 76 3x10 76 3x10  Standing calf raises         Mini squats         Wall slides         Forward step downs 6 3x10 6 3x10 6 3x10 6 3x10 6 3x10 6 3x10 6 3x10  Step ups 8 3x10 8 3x10 8 3x10 8 3x10 8 3x10 8 3x10 8 3x10  Multi-Hip TKE 75 3x10 75 3x10 75 3x10 80 3x10 80 3x10 80 3x10 80 3x12  Multi-Hip Flex/Abd 50/50 3x10 50/50 3x10 60/60 3x10 60/60 3x10 60/60 3x10 60/60 3x10 60/60 3x12  Multi-Hip Ext 75 3x10 75 3x10 75 3x10 80 3x10 80 3x10 80 3x10 80 3x12  Knee Extension (Bilat) 20 3x10 20 3x10 20 3x10 20 3x10 20 3x10 20 3x10 20 3x12  Leg Curls (Bilat) 35 3x10 35 3x10 35 3x10 35 3x10 35 3x10 35 3x10 35 3x12  SAQ         SLR (flex)         Quad Sets         Lunges onto Bosu 3x10 3x10 3x10 Bosu 3x10 Bosu 3x10 Bosu 3x10 Bosu 3x10  NMR: 3 point          NMR: T-band proprio GTB 3x10 Airex GTB 3x10 Airex GTB 30x ea Airex GTB 30x ea Airex GTB 30x ea Airex GTB 30x ea Airex GTB x30 ea  NMR: Wobble Board balance in diagonal stance 2x2' 2x2' 2x2' 2x2' 2x2' 2x2' 2x2'  NMR:  BOSU squats         NMR: Agility         Strap assisted heel slides  Flexionator         Extensionator         Stretch- prone quad         Manual - PROM for left knee flex/ext, ant/post glides, patella mobs         Ultrasound         Infrared         HP/CP with/ without  IFC         Vasopneumatic Level 1/2/3            PT Billing Documentation Date of Onset: 01/31/23 Visit Number: 25       Frequently Used Timed Codes Therapeutic Procedure CPT 97110 : 55 minutes                     Total Treatment Time: 55 minutes  Attestation Statement:   I personally performed the service, incident to.  (I2)   Rollene Bellman, PTA

## 2023-09-16 ENCOUNTER — Other Ambulatory Visit: Payer: Self-pay

## 2023-09-16 LAB — GENECONNECT MOLECULAR SCREEN: Genetic Analysis Overall Interpretation: NEGATIVE

## 2023-09-19 ENCOUNTER — Other Ambulatory Visit: Payer: Self-pay

## 2023-10-20 ENCOUNTER — Other Ambulatory Visit: Payer: Self-pay

## 2023-10-21 ENCOUNTER — Other Ambulatory Visit: Payer: Self-pay

## 2023-11-17 ENCOUNTER — Other Ambulatory Visit: Payer: Self-pay

## 2023-11-18 ENCOUNTER — Other Ambulatory Visit: Payer: Self-pay

## 2023-11-18 MED ORDER — EMGALITY 120 MG/ML ~~LOC~~ SOAJ
120.0000 mg | SUBCUTANEOUS | 1 refills | Status: AC
  Filled 2023-11-18: qty 1, 30d supply, fill #0
  Filled 2023-12-16: qty 1, 30d supply, fill #1

## 2023-12-05 ENCOUNTER — Other Ambulatory Visit: Payer: Self-pay

## 2023-12-09 NOTE — Progress Notes (Signed)
 HPI:  Erin Hull is a 43 y.o. female who presents for follow-up now 10+ months status post an arthroscopically assisted ACL reconstruction using bone-patella tendon-bone autograft in addition to a lateral extra-articular tenodesis and thermoplasty of the PCL of her left knee.  Overall, the patient feels that she is doing well.  She still does some mild occasional aching discomfort in the knee, especially with prolonged standing or ambulation.  She rates this discomfort at 1/10 on today's visit, and will take over-the-counter ibuprofen  or Tylenol  as necessary with relief.  In addition, she still gets a sense of instability at times, especially with certain pivoting motions which she tries to avoid.  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  She has resumed all of her normal daily activities without difficulty and is sleeping well at night.  She also is able to reciprocate stairs.  She has not yet begun trying to run.  She denies any reinjury to the knee, and denies any fevers or chills.  Current Outpatient Medications  Medication Sig Dispense Refill  . acetaminophen  (TYLENOL ) 325 MG tablet Take by mouth Take 650 mg by mouth every 6 (six) hours as needed.    . acetaminophen  (TYLENOL ) 500 MG tablet Take 1,000 mg by mouth every 6 (six) hours as needed    . galcanezumab -gnlm 120 mg/mL PnIj Inject 120 mg subcutaneously monthly 1 mL 1  . ibuprofen  (MOTRIN ) 200 MG tablet Take by mouth at bedtime as needed    . NURTEC ODT  75 mg disintegrating tablet Take 1 tablet (75 mg total) by mouth as directed 16 tablet 11  . rimegepant (NURTEC ODT ) 75 mg disintegrating tablet Take 75 mg by mouth once    . WEGOVY  1.7 mg/0.75 mL pen injector Inject 1 mg subcutaneously once a week (Patient not taking: Reported on 12/09/2023)     No current facility-administered medications for this visit.    Allergies  Allergen Reactions  . Lortab [Hydrocodone-Acetaminophen ] Rash and Hives  . Percocet [Oxycodone -Acetaminophen ] Rash and Hives   Past Medical History:  Diagnosis Date  . Arthritis   . Crohn's disease (CMS/HHS-HCC)   . Crohn's disease (CMS/HHS-HCC)   . Depression   . GERD (gastroesophageal reflux disease)   . Migraine headache    Past Surgical History:  Procedure Laterality Date  . LAPAROSCOPIC REMOVAL ECTOPIC PREGNANCY  06/10/2018   left side  . Arthroscopically-assisted anterior cruciate ligament reconctruction using bone-patella tendon-bone autograft and lateral extra-articular tenodesis, thermoplasty of PCL Left 01/31/2023   Dr.Poggi  . left knee arthoscopy     2  . tonsilectomy      Family History  Problem Relation Name Age of Onset  . Gout Mother    . Diabetes Father    . Heart disease Father    . COPD Father    . Coronary Artery Disease (Blocked arteries around heart) Father    . Myocardial Infarction (Heart attack) Father    . High blood pressure (Hypertension) Father    . Stroke Father    . Tremor Father    . Diabetes Brother    . Myocardial Infarction (Heart attack) Maternal Grandmother    . Aneurysm Paternal Grandmother    . Brain hemorrhage Paternal Grandmother      Social History   Socioeconomic History  . Marital status: Single  Tobacco Use  . Smoking status:  Never  . Smokeless tobacco: Never  Vaping Use  . Vaping status: Never Used  Substance and Sexual Activity  . Alcohol use: No  . Drug use: No  . Sexual activity: Yes    Review of Systems:  A comprehensive 14 point ROS was performed, reviewed, and the pertinent orthopaedic findings are documented in the HPI.  Exam: Vitals:   12/09/23 0846  BP: (!) 140/80  Weight: 83.6 kg (184 lb 6.4 oz)  Height: 170.2 cm (5' 7)  PainSc: 0-No pain  PainLoc: Knee   General/Constitutional: The patient appears to be well-nourished, well-developed, and in no acute distress. Neuro/Psych:  Normal mood and affect, oriented to person, place and time.  Left knee exam: The patient demonstrates a normal gait and does not use any assistive devices.  On inspection, her surgical incisions are well-healed and without evidence for infection.  No swelling, erythema, ecchymosis, abrasions, or other skin abnormalities are identified.  There is at most minimal tenderness to palpation over the tibial tubercle in the area of the bone donor site, but no tenderness to palpation is noted along the medial or lateral aspects of the knee, nor is there any tenderness in the peripatellar region.  Actively, she can extend her knee fully and flex her knee beyond 130 degrees.  She exhibits a trace positive posterior drawer test but her Lachman's test shows only 2 mm of displacement with a good endpoint.  There is no varus or valgus laxity.  Her patella tracks well and is without crepitance.  She remains neurovascularly intact to the left lower extremity and foot.  Assessment: Encounter Diagnoses  Name Primary?  . 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 Status post reconstruction of anterior cruciate ligament of left knee using bone-patellar tendon-bone autograft     Plan: The treatment options were discussed with the patient.  In addition, patient educational materials were provided regarding the diagnosis and treatment options.  Overall, the patient is pleased with her symptomatic and functional improvement at this time.  Based on her examination findings, the graft/reconstruction appears stable.  Therefore, I have recommended that she continue with her home exercise program, emphasizing quadriceps and hamstring strengthening exercises.  She may continue to progress in her activities as symptoms permit, but is to avoid offending activities.  She may take over-the-counter medications as needed for discomfort.  All of the patient's questions and concerns were answered.  She can call any time with further  concerns.  She will follow up on an as necessary basis per her request.

## 2023-12-16 ENCOUNTER — Other Ambulatory Visit: Payer: Self-pay

## 2023-12-17 ENCOUNTER — Other Ambulatory Visit: Payer: Self-pay

## 2023-12-30 ENCOUNTER — Other Ambulatory Visit: Payer: Self-pay

## 2024-01-07 ENCOUNTER — Other Ambulatory Visit: Payer: Self-pay

## 2024-01-11 ENCOUNTER — Telehealth: Admitting: Nurse Practitioner

## 2024-01-11 DIAGNOSIS — J019 Acute sinusitis, unspecified: Secondary | ICD-10-CM | POA: Diagnosis not present

## 2024-01-11 DIAGNOSIS — B9689 Other specified bacterial agents as the cause of diseases classified elsewhere: Secondary | ICD-10-CM

## 2024-01-11 MED ORDER — AMOXICILLIN-POT CLAVULANATE 875-125 MG PO TABS
1.0000 | ORAL_TABLET | Freq: Two times a day (BID) | ORAL | 0 refills | Status: AC
Start: 2024-01-11 — End: 2024-01-18

## 2024-01-11 NOTE — Patient Instructions (Signed)
 Erin Hull, thank you for joining Collins Dean, NP for today's virtual visit.  While this provider is not your primary care provider (PCP), if your PCP is located in our provider database this encounter information will be shared with them immediately following your visit.   A [facility] MyChart account gives you access to today's visit and all your visits, tests, and labs performed at [facility] " click here if you don't have a [facility] MyChart account or go to [email or web address]  Consent: (Patient) Erin Hull provided verbal consent for this virtual visit at the beginning of the encounter.  Current Medications:  Current Outpatient Medications:    amoxicillin -clavulanate (AUGMENTIN ) 875-125 MG tablet, Take 1 tablet by mouth 2 (two) times daily for 7 days., Disp: 14 tablet, Rfl: 0   acetaminophen  (TYLENOL ) 500 MG tablet, Take 1,000 mg by mouth every 6 (six) hours as needed., Disp: , Rfl:    Galcanezumab -gnlm (EMGALITY ) 120 MG/ML SOAJ, Inject 120 mg subcutaneously every 28 (twenty-eight) days Start 1 month after loading dose, Disp: 1 mL, Rfl: 11   Galcanezumab -gnlm (EMGALITY ) 120 MG/ML SOAJ, Inject 120 mg into the skin every 28 (twenty-eight) days. Start 1 month after loading dose, Disp: 1 mL, Rfl: 11   Galcanezumab -gnlm (EMGALITY ) 120 MG/ML SOAJ, Inject 120 mg into the skin every 30 (thirty) days., Disp: 1 mL, Rfl: 1   ibuprofen  (ADVIL ) 200 MG tablet, Take 200 mg by mouth every 6 (six) hours as needed., Disp: , Rfl:    Rimegepant Sulfate  (NURTEC) 75 MG TBDP, Take 1 tablet (75 mg total) by mouth as directed, Disp: 16 tablet, Rfl: 11   Rimegepant Sulfate  (NURTEC) 75 MG TBDP, Take 1 tablet (75 mg total) by mouth as directed., Disp: 16 tablet, Rfl: 11   Semaglutide -Weight Management (WEGOVY ) 1.7 MG/0.75ML SOAJ, Inject 1.7 mg into the skin every 7 (seven) days., Disp: 3 mL, Rfl: 5   Semaglutide -Weight Management (WEGOVY ) 1.7 MG/0.75ML SOAJ, Inject 1.7 mg into the  skin once a week., Disp: 9 mL, Rfl: 0   traMADol  (ULTRAM ) 50 MG tablet, Take 1 tablet (50 mg total) by mouth every 6 (six) hours as needed for moderate pain or severe pain., Disp: 30 tablet, Rfl: 0   Medications ordered in this encounter:  Meds ordered this encounter  Medications   amoxicillin -clavulanate (AUGMENTIN ) 875-125 MG tablet    Sig: Take 1 tablet by mouth 2 (two) times daily for 7 days.    Dispense:  14 tablet    Refill:  0    Supervising Provider:   Corine Dice [[id number]]     *If you need refills on other medications prior to your next appointment, please contact your pharmacy*  Follow-Up: Call back or seek an in-person evaluation if the symptoms worsen or if the condition fails to improve as anticipated.  [facility] [phone]   If you have been instructed to have an in-person evaluation today at a local Urgent Care facility, please use the link below. It will take you to a list of all of our available [facility], including address, phone number and hours of operation. Please do not delay care.  [facility]  If you or a family member do not have a primary care provider, use the link below to schedule a visit and establish care. When you choose a [facility] primary care physician or advanced practice provider, you gain a long-term partner in health. Find a Primary Care Provider  Learn more about [facility] in-office and virtual care options: [facility] - Get Care Now

## 2024-01-11 NOTE — Progress Notes (Signed)
 Virtual Visit Consent   Erin Hull, you are scheduled for a virtual visit with a [facility] provider today. Just as with appointments in the office, your consent must be obtained to participate. Your consent will be active for this visit and any virtual visit you may have with one of our providers in the next 365 days. If you have a MyChart account, a copy of this consent can be sent to you electronically.  As this is a virtual visit, video technology does not allow for your provider to perform a traditional examination. This may limit your provider's ability to fully assess your condition. If your provider identifies any concerns that need to be evaluated in person or the need to arrange testing (such as labs, EKG, etc.), we will make arrangements to do so. Although advances in technology are sophisticated, we cannot ensure that it will always work on either your end or our end. If the connection with a video visit is poor, the visit may have to be switched to a telephone visit. With either a video or telephone visit, we are not always able to ensure that we have a secure connection.  By engaging in this virtual visit, you consent to the provision of healthcare and authorize for your insurance to be billed (if applicable) for the services provided during this visit. Depending on your insurance coverage, you may receive a charge related to this service.  I need to obtain your verbal consent now. Are you willing to proceed with your visit today? Erin Hull has provided verbal consent on 01/11/2024 for a virtual visit (video or telephone). Collins Dean, NP  Date: 01/11/2024 4:07 PM   Virtual Visit via Video Note   I, Collins Dean, connected with  Erin Hull  ([id number], May 29, 1981) on 01/11/24 at  3:45 PM EDT by a video-enabled telemedicine application and verified that I am speaking with the correct person using two identifiers.  Location: Patient: Virtual Visit Location Patient:  Home Provider: Virtual Visit Location Provider: Home Office   I discussed the limitations of evaluation and management by telemedicine and the availability of in person appointments. The patient expressed understanding and agreed to proceed.    History of Present Illness: Erin Hull is a 43 y.o. who identifies as a female who was assigned female at birth, and is being seen today for sinusitis.  [name] has been experiencing cold and sinus symptoms for the past 2 weeks. Over the last few days symptoms have worsened  and currently include sinus (frontal and maxillary) sinus pain, severe headache, dental pain, sinus pressue . She has been taking sudafed tylenol  and ibuprofen  with little relief. Denies fever, cough     Problems:  Patient Active Problem List   Diagnosis Date Noted   Mass of right breast 07/30/2022   Ganglion cyst 07/30/2022   Class 1 obesity due to excess calories without serious comorbidity with body mass index (BMI) of 33.0 to 33.9 in adult 03/16/2022   Family history of cerebral aneurysm 03/16/2022   Trochanteric bursitis, right hip 01/30/2021   History of Crohn's disease    Obesity with body mass index 30 or greater 12/05/2020   Crohn disease (HCC) 12/05/2020   Abdominal pain, RLQ (right lower quadrant) 12/05/2020   Adult attention deficit hyperactivity disorder 12/05/2020   Allergic rhinitis 12/05/2020   Attention-deficit hyperactivity disorder 12/05/2020   Cervical dysplasia 12/05/2020   Backache 12/05/2020   Ectopic pregnancy 12/05/2020   Dyspareunia 12/05/2020  Essential hematuria 12/05/2020   Fatigue 12/05/2020   Old posterior cruciate ligament disruption 12/05/2020   Complication of pregnancy, antepartum 12/05/2020   Threatened premature labor 12/05/2020   Tear of medial cartilage or meniscus of knee, current 12/05/2020   Syncope and collapse 12/05/2020   Sprain and strain of hip and thigh 12/05/2020   Preventative health care 12/05/2020   Viral  warts 12/05/2020   Sciatica 12/05/2020   Personal history of pre-term labor 12/05/2020   Pain in joint, hand 12/05/2020   Pain in joint, forearm 12/05/2020   Ovarian cyst 12/05/2020   Other diseases of nasal cavity and sinuses 12/05/2020   Migraine with aura and without status migrainosus, not intractable 05/10/2016    Allergies:  Allergies  Allergen Reactions   Lortab [Hydrocodone-Acetaminophen ] Hives   Percocet [Oxycodone -Acetaminophen ] Hives   Stellaria Other (See Comments)    Patient states she felt like she was high    Medications:  Current Outpatient Medications:    amoxicillin -clavulanate (AUGMENTIN ) 875-125 MG tablet, Take 1 tablet by mouth 2 (two) times daily for 7 days., Disp: 14 tablet, Rfl: 0   acetaminophen  (TYLENOL ) 500 MG tablet, Take 1,000 mg by mouth every 6 (six) hours as needed., Disp: , Rfl:    Galcanezumab -gnlm (EMGALITY ) 120 MG/ML SOAJ, Inject 120 mg subcutaneously every 28 (twenty-eight) days Start 1 month after loading dose, Disp: 1 mL, Rfl: 11   Galcanezumab -gnlm (EMGALITY ) 120 MG/ML SOAJ, Inject 120 mg into the skin every 28 (twenty-eight) days. Start 1 month after loading dose, Disp: 1 mL, Rfl: 11   Galcanezumab -gnlm (EMGALITY ) 120 MG/ML SOAJ, Inject 120 mg into the skin every 30 (thirty) days., Disp: 1 mL, Rfl: 1   ibuprofen  (ADVIL ) 200 MG tablet, Take 200 mg by mouth every 6 (six) hours as needed., Disp: , Rfl:    Rimegepant Sulfate  (NURTEC) 75 MG TBDP, Take 1 tablet (75 mg total) by mouth as directed, Disp: 16 tablet, Rfl: 11   Rimegepant Sulfate  (NURTEC) 75 MG TBDP, Take 1 tablet (75 mg total) by mouth as directed., Disp: 16 tablet, Rfl: 11   Semaglutide -Weight Management (WEGOVY ) 1.7 MG/0.75ML SOAJ, Inject 1.7 mg into the skin every 7 (seven) days., Disp: 3 mL, Rfl: 5   Semaglutide -Weight Management (WEGOVY ) 1.7 MG/0.75ML SOAJ, Inject 1.7 mg into the skin once a week., Disp: 9 mL, Rfl: 0   traMADol  (ULTRAM ) 50 MG tablet, Take 1 tablet (50 mg total) by  mouth every 6 (six) hours as needed for moderate pain or severe pain., Disp: 30 tablet, Rfl: 0  Observations/Objective: Patient is well-developed, well-nourished in no acute distress.  Resting comfortably at home.  Head is normocephalic, atraumatic.  No labored breathing.  Speech is clear and coherent with logical content.  Patient is alert and oriented at baseline.    Assessment and Plan: 1. Acute bacterial sinusitis (Primary) - amoxicillin -clavulanate (AUGMENTIN ) 875-125 MG tablet; Take 1 tablet by mouth 2 (two) times daily for 7 days.  Dispense: 14 tablet; Refill: 0   Follow Up Instructions: I discussed the assessment and treatment plan with the patient. The patient was provided an opportunity to ask questions and all were answered. The patient agreed with the plan and demonstrated an understanding of the instructions.  A copy of instructions were sent to the patient via MyChart unless otherwise noted below.    The patient was advised to call back or seek an in-person evaluation if the symptoms worsen or if the condition fails to improve as anticipated.    Makail Watling W  Jamal Mays, NP

## 2024-01-15 ENCOUNTER — Other Ambulatory Visit: Payer: Self-pay

## 2024-01-16 ENCOUNTER — Other Ambulatory Visit: Payer: Self-pay

## 2024-01-25 ENCOUNTER — Other Ambulatory Visit: Payer: Self-pay

## 2024-01-27 ENCOUNTER — Other Ambulatory Visit: Payer: Self-pay

## 2024-01-28 ENCOUNTER — Other Ambulatory Visit: Payer: Self-pay

## 2024-01-30 ENCOUNTER — Other Ambulatory Visit: Payer: Self-pay

## 2024-01-31 ENCOUNTER — Other Ambulatory Visit: Payer: Self-pay

## 2024-02-03 ENCOUNTER — Other Ambulatory Visit: Payer: Self-pay

## 2024-02-04 ENCOUNTER — Other Ambulatory Visit: Payer: Self-pay

## 2024-02-09 NOTE — Progress Notes (Signed)
 Erin Hull is a 43 y.o. female complains of sore throat

## 2024-02-09 NOTE — Progress Notes (Signed)
 Subjective Patient ID: Erin Hull is a 43 y.o. female.    Sabreen Kitchen is a 43 y.o. female with no significant medical history to [facility] for sore throat x 3-4 days. Rates pain 5/10 and describes as soreness, increasing with swallowing. Denies any other upper respiratory symptoms. Has been taking over-the-counter medications with minimal improvement in symptoms. Verbalizes history of tonsillectomy. Denies any fever.     Review of Systems  Constitutional:  Negative for chills, fatigue and fever.  HENT:  Positive for sore throat. Negative for congestion, drooling, ear pain, facial swelling, postnasal drip, rhinorrhea, sinus pressure, sinus pain, sneezing and trouble swallowing.   Eyes:  Negative for discharge, redness and itching.  Respiratory:  Negative for cough, chest tightness, shortness of breath and wheezing.   Cardiovascular:  Negative for chest pain.  Gastrointestinal:  Negative for abdominal pain, nausea and vomiting.  Musculoskeletal:  Negative for arthralgias and myalgias.  Skin:  Negative for rash.  Allergic/Immunologic: Negative for environmental allergies.  Neurological:  Negative for dizziness and headaches.  Hematological:  Negative for adenopathy.  Psychiatric/Behavioral:  Negative for sleep disturbance.     Patient History  Allergies: Allergies  Allergen Reactions  . Hydrocodone-Acetaminophen  Hives and Rash  . Oxycodone -Acetaminophen  Hives and Rash  . Stellaria Rash    Patient states she felt like she was high    History reviewed. No pertinent past medical history. History reviewed. No pertinent surgical history. Social History   Socioeconomic History  . Marital status: Divorced    Spouse name: Not on file  . Number of children: Not on file  . Years of education: Not on file  . Highest education level: Not on file  Occupational History  . Not on file  Tobacco Use  . Smoking status: Never  . Smokeless tobacco: Never  Substance and Sexual Activity   . Alcohol use: Defer  . Drug use: Not on file  . Sexual activity: Not on file  Other Topics Concern  . Not on file  Social History Narrative  . Not on file   History reviewed. No pertinent family history. No current outpatient medications on file prior to visit.   No current facility-administered medications on file prior to visit.    Objective  Vitals:   02/09/24 0936  BP: 114/80  Pulse: 84  Resp: 18  Temp: 37.4 C (99.3 F)  TempSrc: Oral  SpO2: 98%  Weight: 80.4 kg  Height: 5' 7  PainSc:   7  LMP: 01/13/2024        OBGYN/Pregnancy Status: Having periods      No results found.  Physical Exam Vitals and nursing note reviewed.  Constitutional:      General: She is not in acute distress.    Appearance: Normal appearance. She is not ill-appearing, toxic-appearing or diaphoretic.  HENT:     Head: Normocephalic and atraumatic.     Right Ear: Tympanic membrane, ear canal and external ear normal. No swelling or tenderness. There is no impacted cerumen. Tympanic membrane is not perforated, erythematous, retracted or bulging.     Left Ear: Tympanic membrane, ear canal and external ear normal. No swelling or tenderness. There is no impacted cerumen. Tympanic membrane is not perforated, erythematous, retracted or bulging.     Nose: Nose normal. No congestion or rhinorrhea.     Right Sinus: No maxillary sinus tenderness or frontal sinus tenderness.     Left Sinus: No maxillary sinus tenderness or frontal sinus tenderness.     Mouth/Throat:  Pharynx: Oropharynx is clear. Uvula midline. Posterior oropharyngeal erythema present. No pharyngeal swelling, oropharyngeal exudate, uvula swelling or postnasal drip.  Eyes:     General: No scleral icterus.       Right eye: No discharge.        Left eye: No discharge.     Extraocular Movements: Extraocular movements intact.     Conjunctiva/sclera: Conjunctivae normal.     Pupils: Pupils are equal, round, and reactive to light.   Cardiovascular:     Rate and Rhythm: Normal rate and regular rhythm.     Pulses: Normal pulses.     Heart sounds: Normal heart sounds. No murmur heard. Pulmonary:     Effort: Pulmonary effort is normal. No respiratory distress.     Breath sounds: Normal breath sounds. No decreased breath sounds, wheezing, rhonchi or rales.  Musculoskeletal:     Cervical back: Normal range of motion and neck supple.  Lymphadenopathy:     Head:     Right side of head: No submandibular, preauricular or posterior auricular adenopathy.     Left side of head: No submandibular, preauricular or posterior auricular adenopathy.     Cervical: No cervical adenopathy.  Skin:    General: Skin is warm and dry.     Capillary Refill: Capillary refill takes less than 2 seconds.  Neurological:     General: No focal deficit present.     Mental Status: She is alert and oriented to person, place, and time.  Psychiatric:        Mood and Affect: Mood normal.        Behavior: Behavior normal.     Results for orders placed or performed in visit on 02/09/24  POCT rapid strep A manually resulted  Component Result   Rapid Strep A Screen Positive (A)   Internal Quality Control Pass       Procedures MDM:     1 Acute, uncomplicated illness or injury     Unique ordered tests: One     Assessment requiring historian other than patient: No     Independent visualization of image, tracing, or test: No     Discussion of management with another provider: No     Risk:: Moderate            Assessment/Plan Diagnoses and all orders for this visit:  Strep throat -     POCT rapid strep A manually resulted -     penicillin v potassium (Veetid) 500 MG tablet; Take 1 tablet (500 mg total) by mouth in the morning and 1 tablet (500 mg total) in the evening. Do all this for 10 days.     Disposition Status: Home  Patient Instructions  Continue taking tylenol  and/ motrin  as needed for symptoms Progress note signed by  Christain Asp, NP on 02/09/24 at 10:25 AM

## 2024-02-25 ENCOUNTER — Other Ambulatory Visit: Payer: Self-pay

## 2024-03-13 ENCOUNTER — Encounter (HOSPITAL_BASED_OUTPATIENT_CLINIC_OR_DEPARTMENT_OTHER): Payer: Self-pay

## 2024-03-13 ENCOUNTER — Other Ambulatory Visit: Payer: Self-pay

## 2024-03-13 ENCOUNTER — Emergency Department (HOSPITAL_BASED_OUTPATIENT_CLINIC_OR_DEPARTMENT_OTHER)
Admission: EM | Admit: 2024-03-13 | Discharge: 2024-03-13 | Disposition: A | Discharge status: Home / Self Care | Attending: Emergency Medicine | Admitting: Emergency Medicine

## 2024-03-13 DIAGNOSIS — M5412 Radiculopathy, cervical region: Secondary | ICD-10-CM | POA: Diagnosis not present

## 2024-03-13 DIAGNOSIS — M542 Cervicalgia: Secondary | ICD-10-CM | POA: Diagnosis present

## 2024-03-13 MED ORDER — METHYLPREDNISOLONE 4 MG PO TBPK
ORAL_TABLET | ORAL | 0 refills | Status: DC
  Filled 2024-03-13: qty 21, 6d supply, fill #0

## 2024-03-13 MED ORDER — DEXAMETHASONE 4 MG PO TABS
4.0000 mg | ORAL_TABLET | Freq: Once | ORAL | Status: AC
  Administered 2024-03-13: 4 mg via ORAL
  Filled 2024-03-13: qty 1

## 2024-03-13 MED ORDER — KETOROLAC TROMETHAMINE 10 MG PO TABS
10.0000 mg | ORAL_TABLET | Freq: Four times a day (QID) | ORAL | 0 refills | Status: DC | PRN
  Filled 2024-03-13: qty 20, 5d supply, fill #0

## 2024-03-13 MED ORDER — KETOROLAC TROMETHAMINE 15 MG/ML IJ SOLN
15.0000 mg | Freq: Once | INTRAMUSCULAR | Status: AC
  Administered 2024-03-13: 15 mg via INTRAMUSCULAR
  Filled 2024-03-13: qty 1

## 2024-03-13 NOTE — ED Provider Notes (Signed)
 [place] EMERGENCY DEPARTMENT AT [facility] Provider Note   CSN: [id number] Arrival date & time: 03/13/24  1147     Patient presents with: Back Pain, Arm Pain (L), and Neck Pain   Erin Hull is a 43 y.o. female.   Patient with history of migraines, chronic back pain, Crohn's disease, arthritis presents today with complaints of back pain. Reports that pain is worse in the upper thoracic spine area as well as into the left shoulder area. Reports that she was seen for similar pain a few years ago and had an MRI that showed some changes but nothing emergently concerning. Reports that she has had other flares of similar pain prior to this as well. She reports that she originally followed up with neurology who wanted her to have steroid joint injections as well as nerve conduction studies, however her insurance through the [facility] declined coverage for these and therefore she never had any of these interventions. Reports that her pain improved over time but never completely resolved. Also reports some decreased sensation in her left arm and weakness in her left hand that never completely resolved either. Reports that her symptoms had been under control until a few weeks ago when her back pain started to worsen. Reports that this morning her pain was worse and did not seem to improve despite tylenol  and ibuprofen  as well as a lidocaine  patch and therefore she presented here for evaluation. She did have a headache this morning as well but reports that this was a typical migraine for her and resolved with her migraine medications. She denies any trauma, no fevers or chills, no numbness or weakness in her lower extremities, no loss of bowel or bladder function or saddle anesthesia.  She is not on any chronic steroids for her Crohns disease.   The history is provided by the patient. No language interpreter was used.  Back Pain Arm Pain  Neck Pain      Prior to Admission medications    Medication Sig Start Date End Date Taking? Authorizing Provider  acetaminophen  (TYLENOL ) 500 MG tablet Take 1,000 mg by mouth every 6 (six) hours as needed.    [provider]  Galcanezumab -gnlm (EMGALITY ) 120 MG/ML SOAJ Inject 120 mg subcutaneously every 28 (twenty-eight) days Start 1 month after loading dose 02/08/22     Galcanezumab -gnlm (EMGALITY ) 120 MG/ML SOAJ Inject 120 mg into the skin every 28 (twenty-eight) days. Start 1 month after loading dose 11/14/22     Galcanezumab -gnlm (EMGALITY ) 120 MG/ML SOAJ Inject 120 mg into the skin every 30 (thirty) days. 11/18/23     ibuprofen  (ADVIL ) 200 MG tablet Take 200 mg by mouth every 6 (six) hours as needed.    [provider]  Rimegepant Sulfate  (NURTEC) 75 MG TBDP Take 1 tablet (75 mg total) by mouth as directed 02/08/22     Rimegepant Sulfate  (NURTEC) 75 MG TBDP Take 1 tablet (75 mg total) by mouth as directed. 03/18/23     Semaglutide -Weight Management (WEGOVY ) 1.7 MG/0.75ML SOAJ Inject 1.7 mg into the skin every 7 (seven) days. 09/24/22     Semaglutide -Weight Management (WEGOVY ) 1.7 MG/0.75ML SOAJ Inject 1.7 mg into the skin once a week. 10/15/22       Allergies: Lortab [hydrocodone-acetaminophen ], Percocet [oxycodone -acetaminophen ], and Stellaria    Review of Systems  Musculoskeletal:  Positive for back pain and neck pain.  All other systems reviewed and are negative.   Updated Vital Signs BP (!) 129/98 (BP Location: Right Arm)  Pulse 77   Temp 97.9 F (36.6 C)   Resp 18   SpO2 100%   Physical Exam Vitals and nursing note reviewed.  Constitutional:      General: She is not in acute distress.    Appearance: Normal appearance. She is normal weight. She is not ill-appearing, toxic-appearing or diaphoretic.  HENT:     Head: Normocephalic and atraumatic.  Cardiovascular:     Rate and Rhythm: Normal rate.  Pulmonary:     Effort: Pulmonary effort is normal. No respiratory distress.  Musculoskeletal:        General:  Normal range of motion.     Cervical back: Normal range of motion.     Comments: No midline tenderness to palpation of the cervical spine, does have midline tenderness to the upper thoracic spine as well as left sided muscle tightness and tenderness along the left trapezius.   5/5 strength intact to the left shoulder and elbow, 4/5 noted to the left hand compared to the right   Sensation is subjectively diminished in the left posterior upper arm and fourth and fifth digits   Positive Spurling sign to the left  Skin:    General: Skin is warm and dry.  Neurological:     General: No focal deficit present.     Mental Status: She is alert.  Psychiatric:        Mood and Affect: Mood normal.        Behavior: Behavior normal.     (all labs ordered are listed, but only abnormal results are displayed) Labs Reviewed - No data to display  EKG: None  Radiology: No results found.   Procedures   Medications Ordered in the ED  ketorolac  (TORADOL ) 15 MG/ML injection 15 mg (15 mg Intramuscular Given 03/13/24 1434)  dexamethasone  (DECADRON ) tablet 4 mg (4 mg Oral Given 03/13/24 1433)                                    Medical Decision Making Risk Prescription drug management.    Patient presents today with complaints of left-sided upper back and neck pain.  Reports this is a flare of her chronic pain that worsened this morning.  She is afebrile, nontoxic-appearing, and in no acute distress with reassuring vital signs.  Physical exam reveals midline thoracic tenderness but no midline cervical tenderness, somewhat diminished strength to the left hand, however the left elbow and shoulder are full strength compared to the right.  She does have positive Spurling sign to the left, therefore symptoms likely due to cervical radiculopathy, likely in adjunct to her chronic pain.  Chart reviewed, appears that she was here for very similar presentation back in 2022, was referred to neurosurgery but  unfortunately was unable to follow-up.  She did see neurology and I can see where they recommended she have nerve conduction studies and MRIs, she reports that insurance declined coverage for her nerve conduction studies and she had an MRI through the [facility].  I am unable to review the results of this MRI, however patient reports that there were some changes on her MRI, but nothing they were emergently concerned about.  Of note, patient is a Engineer, civil (consulting) at [employer].  Reports when she had this previous pain she was given Toradol  and had some improvement.  She reports that steroid injections were mentioned but never happened.  Ultimately, the patient would likely benefit with close outpatient  neurosurgery follow-up.  She does not have any signs or symptoms to suggest need for emergent MRI.  No signs or risk factors to suggest epidural abscess or cauda equina, she has no signs or symptoms to suggest infectious etiology of symptoms.  Pain is atraumatic.  She has no cardiac risk factors or signs of symptoms to suggest ACS, AAA, or PE.  Patient given Toradol  and Decadron  today with improvement.  Will provide neurosurgery referral for follow-up.  Will give a Medrol  Dosepak and oral Toradol  to go home with.  RICE and lifting precautions recommended and discussed as well. Evaluation and diagnostic testing in the emergency department does not suggest an emergent condition requiring admission or immediate intervention beyond what has been performed at this time.  Plan for discharge with close PCP follow-up.  Patient is understanding and amenable with plan, educated on red flag symptoms that would prompt immediate return.  Patient discharged in stable condition.  Final diagnoses:  Cervical radiculopathy    ED Discharge Orders          Ordered    methylPREDNISolone  (MEDROL  DOSEPAK) 4 MG TBPK tablet        03/13/24 1456    ketorolac  (TORADOL ) 10 MG tablet  Every 6 hours PRN        03/13/24 1456          An After Visit  Summary was printed and given to the patient.      Erin Hull 03/13/24 1458    Cottie Erin PARAS, MD 03/13/24 [id number]

## 2024-03-13 NOTE — ED Triage Notes (Signed)
 Pt c/o upper back pain, neck pain, L arm pain intermittently x last couple weeks, but I woke up this morning & it was just extreme.   Hx radiculopathy c5-c7, advises shooting pain down arm intermittently

## 2024-03-13 NOTE — Discharge Instructions (Signed)
 As we discussed, I have given you a shot of Toradol  and a dose of steroids in the emergency department today to help with management of your symptoms.  I have given you a prescription for a steroid pack for you to go home with as well.  Please take this as prescribed in its entirety.  Have also given you a prescription for Toradol  which you can take as prescribed as needed for management of your pain.  Do not take this with ibuprofen , only take 1 or the other.  You can also take this with 1000 mg of Tylenol  every 6 hours for a maximum daily dose of 4000 mg.  Ultimately, given this is a longstanding issue, it is very important that you follow-up with neurosurgery to discuss long-term management of your pain.  I have given you a referral with a number to call to schedule an appointment.  Please do so at your earliest convenience.  You can also follow-up with your PCP at your earliest convenience.  In the interim, I recommend that you get plenty of rest, you can try heating pad over the area that is sore and I would also reduce the amount that you are lifting at a time to less than 10 pounds.  Return if development of any new or worsening symptoms

## 2024-03-25 ENCOUNTER — Other Ambulatory Visit: Payer: Self-pay

## 2024-03-25 DIAGNOSIS — Z30014 Encounter for initial prescription of intrauterine contraceptive device: Secondary | ICD-10-CM | POA: Diagnosis not present

## 2024-03-25 DIAGNOSIS — Z3009 Encounter for other general counseling and advice on contraception: Secondary | ICD-10-CM | POA: Diagnosis not present

## 2024-03-25 MED ORDER — MISOPROSTOL 200 MCG PO TABS
200.0000 ug | ORAL_TABLET | Freq: Once | ORAL | 0 refills | Status: DC
  Filled 2024-03-25 – 2024-04-08 (×3): qty 2, 2d supply, fill #0

## 2024-03-25 MED ORDER — IBUPROFEN 800 MG PO TABS
800.0000 mg | ORAL_TABLET | Freq: Three times a day (TID) | ORAL | 0 refills | Status: AC | PRN
  Filled 2024-03-25 – 2024-04-08 (×3): qty 6, 2d supply, fill #0

## 2024-04-06 ENCOUNTER — Other Ambulatory Visit: Payer: Self-pay

## 2024-04-08 ENCOUNTER — Other Ambulatory Visit: Payer: Self-pay

## 2024-04-22 ENCOUNTER — Other Ambulatory Visit: Payer: Self-pay | Admitting: Family Medicine

## 2024-04-22 DIAGNOSIS — Z1231 Encounter for screening mammogram for malignant neoplasm of breast: Secondary | ICD-10-CM

## 2024-04-24 DIAGNOSIS — Z30430 Encounter for insertion of intrauterine contraceptive device: Secondary | ICD-10-CM | POA: Diagnosis not present

## 2024-04-24 DIAGNOSIS — Z3202 Encounter for pregnancy test, result negative: Secondary | ICD-10-CM | POA: Diagnosis not present

## 2024-04-24 DIAGNOSIS — Z113 Encounter for screening for infections with a predominantly sexual mode of transmission: Secondary | ICD-10-CM | POA: Diagnosis not present

## 2024-04-30 ENCOUNTER — Other Ambulatory Visit: Payer: Self-pay | Admitting: Family Medicine

## 2024-04-30 DIAGNOSIS — Z1231 Encounter for screening mammogram for malignant neoplasm of breast: Secondary | ICD-10-CM

## 2024-05-04 ENCOUNTER — Other Ambulatory Visit: Payer: Self-pay | Admitting: Family Medicine

## 2024-05-04 DIAGNOSIS — M542 Cervicalgia: Secondary | ICD-10-CM

## 2024-05-15 ENCOUNTER — Ambulatory Visit
Admission: RE | Admit: 2024-05-15 | Discharge: 2024-05-15 | Disposition: A | Discharge status: Home / Self Care | Source: Ambulatory Visit | Attending: Family Medicine | Admitting: Family Medicine

## 2024-05-15 DIAGNOSIS — M542 Cervicalgia: Secondary | ICD-10-CM | POA: Insufficient documentation

## 2024-05-21 ENCOUNTER — Other Ambulatory Visit: Payer: Self-pay | Admitting: Family Medicine

## 2024-05-21 DIAGNOSIS — N644 Mastodynia: Secondary | ICD-10-CM

## 2024-05-21 DIAGNOSIS — N63 Unspecified lump in unspecified breast: Secondary | ICD-10-CM

## 2024-05-26 ENCOUNTER — Encounter: Payer: Self-pay | Admitting: Radiology

## 2024-05-26 ENCOUNTER — Ambulatory Visit
Admission: RE | Admit: 2024-05-26 | Discharge: 2024-05-26 | Disposition: A | Discharge status: Home / Self Care | Source: Ambulatory Visit | Attending: Family Medicine | Admitting: Family Medicine

## 2024-05-26 ENCOUNTER — Ambulatory Visit
Admission: RE | Admit: 2024-05-26 | Discharge: 2024-05-26 | Disposition: A | Source: Ambulatory Visit | Attending: Family Medicine | Admitting: Family Medicine

## 2024-05-26 DIAGNOSIS — N644 Mastodynia: Secondary | ICD-10-CM | POA: Diagnosis present

## 2024-05-26 DIAGNOSIS — N63 Unspecified lump in unspecified breast: Secondary | ICD-10-CM | POA: Diagnosis present

## 2024-06-04 DIAGNOSIS — Z30431 Encounter for routine checking of intrauterine contraceptive device: Secondary | ICD-10-CM | POA: Diagnosis not present

## 2024-06-26 ENCOUNTER — Other Ambulatory Visit: Payer: Self-pay

## 2024-06-26 ENCOUNTER — Other Ambulatory Visit: Payer: Self-pay | Admitting: Physician Assistant

## 2024-06-26 DIAGNOSIS — B3731 Acute candidiasis of vulva and vagina: Secondary | ICD-10-CM

## 2024-06-26 MED ORDER — FLUCONAZOLE 150 MG PO TABS
150.0000 mg | ORAL_TABLET | Freq: Once | ORAL | 0 refills | Status: AC
  Filled 2024-06-26: qty 1, 1d supply, fill #0

## 2024-06-26 NOTE — Progress Notes (Signed)
 VV pruritus c/w VVC, empiric Diflucan  150mg  x1 dose sent.

## 2024-07-28 NOTE — Progress Notes (Deleted)
 Referring Physician:  Clinic, [facility] [address] [facility] [place],  [place] [address]  Primary Physician:  Clinic, [facility]  History of Present Illness: 07/28/2024 Erin Hull is here today with a chief complaint of ***  Neck pain  Arm pain and numbness.  Decreased sensation in her left arm. Weakness in her left hand.  Duration: *** Location: *** Quality: *** Severity: ***  Precipitating: aggravated by *** Modifying factors: made better by *** Weakness: none Timing: *** Bowel/Bladder Dysfunction: none  Conservative measures:  Physical therapy: *** Has not participated in? Multimodal medical therapy including regular antiinflammatories: *** tylenol , ibuprofen , and lidocaine   Injections: *** epidural steroid injections?  Past Surgery: ***  Erin Hull has ***no symptoms of cervical myelopathy.  The symptoms are causing a significant impact on the patient's life.   Review of Systems:  A 10 point review of systems is negative, except for the pertinent positives and negatives detailed in the HPI.  Past Medical History: Past Medical History:  Diagnosis Date   Allergy    Seasonal   Anxiety    Arthritis    Complication of anesthesia    Crohn disease (HCC)    Ectopic pregnancy 05/2018   GERD (gastroesophageal reflux disease)    PONV (postoperative nausea and vomiting)     Past Surgical History: Past Surgical History:  Procedure Laterality Date   COLONOSCOPY WITH PROPOFOL  N/A 12/20/2020   Procedure: COLONOSCOPY WITH PROPOFOL ;  Surgeon: Unk Corinn Skiff, MD;  Location: [facility] ENDOSCOPY;  Service: Gastroenterology;  Laterality: N/A;   DILATION AND EVACUATION N/A 06/07/2016   Procedure: DILATATION AND EVACUATION;  Surgeon: Heather Penton, MD;  Location: [facility] ORS;  Service: Gynecology;  Laterality: N/A;   ECTOPIC PREGNANCY SURGERY  2019   KNEE ARTHROSCOPY Left    x2   KNEE ARTHROSCOPY WITH ANTERIOR CRUCIATE LIGAMENT (ACL)  REPAIR Left 01/31/2023   Procedure: KNEE ARTHROSCOPY WITH ANTERIOR CRUCIATE LIGAMENT (ACL) REPAIR;  Surgeon: Edie Norleen PARAS, MD;  Location: [facility] ORS;  Service: Orthopedics;  Laterality: Left;   LIPOMA EXCISION  2002   multiple on lower back   TONSILLECTOMY  2008    Allergies: Allergies as of 08/03/2024 - Review Complete 03/13/2024  Allergen Reaction Noted   Lortab [hydrocodone-acetaminophen ] Hives 04/14/2016   Percocet [oxycodone -acetaminophen ] Hives 04/14/2016   Stellaria Other (See Comments) 12/05/2020    Medications: Outpatient Encounter Medications as of 08/03/2024  Medication Sig   acetaminophen  (TYLENOL ) 500 MG tablet Take 1,000 mg by mouth every 6 (six) hours as needed.   Galcanezumab -gnlm (EMGALITY ) 120 MG/ML SOAJ Inject 120 mg subcutaneously every 28 (twenty-eight) days Start 1 month after loading dose   Galcanezumab -gnlm (EMGALITY ) 120 MG/ML SOAJ Inject 120 mg into the skin every 28 (twenty-eight) days. Start 1 month after loading dose   Galcanezumab -gnlm (EMGALITY ) 120 MG/ML SOAJ Inject 120 mg into the skin every 30 (thirty) days.   ibuprofen  (ADVIL ) 200 MG tablet Take 200 mg by mouth every 6 (six) hours as needed.   ibuprofen  (ADVIL ) 800 MG tablet Take 1 tablet (800 mg total) by mouth every 8 (eight) hours as needed for pain. Take up to 6 doses. Take 1 hour prior to procedure, Then take every 8 hours as needed with food   ketorolac  (TORADOL ) 10 MG tablet Take 1 tablet (10 mg total) by mouth every 6 (six) hours as needed.   methylPREDNISolone  (MEDROL  DOSEPAK) 4 MG TBPK tablet Take as directed on package   misoprostol  (CYTOTEC ) 200 MCG tablet Take 1 tablet (200  mcg total) by mouth once for 1 dose at 8:00pm on the night prior to IUD placement. Then take the second tab in the morning of procedure.   Rimegepant Sulfate  (NURTEC) 75 MG TBDP Take 1 tablet (75 mg total) by mouth as directed   Rimegepant Sulfate  (NURTEC) 75 MG TBDP Take 1 tablet (75 mg total) by mouth as directed.    Semaglutide -Weight Management (WEGOVY ) 1.7 MG/0.75ML SOAJ Inject 1.7 mg into the skin every 7 (seven) days.   Semaglutide -Weight Management (WEGOVY ) 1.7 MG/0.75ML SOAJ Inject 1.7 mg into the skin once a week.   No facility-administered encounter medications on file as of 08/03/2024.    Social History: Social History[1]  Family Medical History: Family History  Problem Relation Age of Onset   Hyperlipidemia Mother    Arthritis Mother    Diabetes Mother    COPD Father    Diabetes Father    Heart disease Father    Hypertension Father    Arthritis Father    Stroke Father    Vision loss Father    Miscarriages / Stillbirths Sister    Diabetes Brother        type 1   Heart attack Maternal Grandmother 53       massive   Parkinson's disease Maternal Grandfather    Other Maternal Grandfather        passed away from heart complications   Pulmonary embolism Paternal Grandmother    Other Paternal Grandmother        brain aneurism   Throat cancer Paternal Grandfather     Physical Examination: @VITALWITHPAIN @  General: Patient is well developed, well nourished, calm, collected, and in no apparent distress. Attention to examination is appropriate.  Psychiatric: Patient is non-anxious.  Head:  Pupils equal, round, and reactive to light.  ENT:  Oral mucosa appears well hydrated.  Neck:   Supple.  ***Full range of motion.  Respiratory: Patient is breathing without any difficulty.  Extremities: No edema.  Vascular: Palpable dorsal pedal pulses.  Skin:   On exposed skin, there are no abnormal skin lesions.  NEUROLOGICAL:     Awake, alert, oriented to person, place, and time.  Speech is clear and fluent. Fund of knowledge is appropriate.   Cranial Nerves: Pupils equal round and reactive to light.  Facial tone is symmetric.  Facial sensation is symmetric.  ROM of spine: ***full.  Palpation of spine: ***non tender.    Strength: Side Biceps Triceps Deltoid Interossei Grip Wrist  Ext. Wrist Flex.  R 5 5 5 5 5 5 5   L 5 5 5 5 5 5 5    Side Iliopsoas Quads Hamstring PF DF EHL  R 5 5 5 5 5 5   L 5 5 5 5 5 5    Reflexes are ***2+ and symmetric at the biceps, triceps, brachioradialis, patella and achilles.   Hoffman's is absent.  Clonus is not present.  Toes are down-going.  Bilateral upper and lower extremity sensation is intact to light touch.    Gait is normal.   No difficulty with tandem gait.   No evidence of dysmetria noted.  Medical Decision Making  Imaging: ***  I have personally reviewed the images and agree with the above interpretation.  Assessment and Plan: Ms. Genson is a pleasant 43 y.o. female with ***    Thank you for involving me in the care of this patient.   I spent a total of *** minutes in both face-to-face and non-face-to-face activities for this visit on  the date of this encounter.   Lyle Decamp, PA-C Dept. of Neurosurgery     [1]  Social History Tobacco Use   Smoking status: Never    Passive exposure: Never   Smokeless tobacco: Never  Vaping Use   Vaping status: Never Used  Substance Use Topics   Alcohol use: No   Drug use: No

## 2024-08-03 ENCOUNTER — Ambulatory Visit: Admitting: Physician Assistant

## 2024-08-03 NOTE — Progress Notes (Signed)
 "   Referring Physician:  Clinic, Bonni Lien [address] [place],  [place] [address]  Primary Physician:  Clinic, [facility] Lien  Discussed the use of AI scribe software for clinical note transcription with the patient, who gave verbal consent to proceed.  History of Present Illness Erin Hull is a 43 year old female with chronic neck pain and left upper extremity numbness and weakness who presents for neurosurgical evaluation.  For the past 2-3 years, she has had intermittent numbness and pain involving the entire left upper extremity with persistent weakness, intermittent dropping of objects, and sharp pain in the left middle finger. Numbness radiates from the left elbow into the hand and can involve both hands with prolonged phone use or reading. She has nocturnal numbness of the left arm that can wake her from sleep, a persistently numb area over the left shoulder blade, and increased sensitivity to impact at the left elbow. She recalls transient arm numbness after a fall as a teenager.  Neck pain began with these symptoms and has progressively worsened. It is chronic with limited range of motion and sharp pain with rotation, and is aggravated by work activities, particularly assisting shorter surgeons. Chiropractic care gave minimal relief. She has not tried physical therapy or injections.  In August 2025, she had a severe flare of left arm symptoms at work with inability to hold objects, severe pain, and numbness radiating down the arm, leading to emergency evaluation and recommendation for neurosurgical assessment. Since then, severe pain has not recurred, but intermittent symptoms persist. She has not had physical therapy, injections, or other interventional treatments. She stopped gabapentin due to side effects.  Conservative measures:  Physical therapy: Has not participated in PT for Neck Multimodal medical therapy including regular  antiinflammatories:  tylenol , ibuprofen , and lidocaine   Injections:  epidural steroid injections  Past Surgery: none  The symptoms are causing a significant impact on the patient's life.   I have utilized the care everywhere function in epic to review the outside records available from external health systems.  Review of Systems:  A 10 point review of systems is negative, except for the pertinent positives and negatives detailed in the HPI.  Past Medical History: Past Medical History:  Diagnosis Date   Allergy    Seasonal   Anxiety    Arthritis    Complication of anesthesia    Crohn disease (HCC)    Ectopic pregnancy 05/2018   GERD (gastroesophageal reflux disease)    PONV (postoperative nausea and vomiting)     Past Surgical History: Past Surgical History:  Procedure Laterality Date   COLONOSCOPY WITH PROPOFOL  N/A 12/20/2020   Procedure: COLONOSCOPY WITH PROPOFOL ;  Surgeon: Unk Corinn Skiff, MD;  Location: [facility];  Service: Gastroenterology;  Laterality: N/A;   DILATION AND EVACUATION N/A 06/07/2016   Procedure: DILATATION AND EVACUATION;  Surgeon: Heather Penton, MD;  Location: [facility];  Service: Gynecology;  Laterality: N/A;   ECTOPIC PREGNANCY SURGERY  2019   KNEE ARTHROSCOPY Left    x2   KNEE ARTHROSCOPY WITH ANTERIOR CRUCIATE LIGAMENT (ACL) REPAIR Left 01/31/2023   Procedure: KNEE ARTHROSCOPY WITH ANTERIOR CRUCIATE LIGAMENT (ACL) REPAIR;  Surgeon: Edie Norleen PARAS, MD;  Location: [facility];  Service: Orthopedics;  Laterality: Left;   LIPOMA EXCISION  2002   multiple on lower back   TONSILLECTOMY  2008    Allergies: Allergies as of 08/14/2024 - Review Complete 08/14/2024  Allergen Reaction Noted   Lortab [hydrocodone-acetaminophen ] Hives 04/14/2016  Percocet [oxycodone -acetaminophen ] Hives 04/14/2016   Stellaria Other (See Comments) 12/05/2020    Medications: Current Medications[1]  Social History: Social History[2]  Family Medical  History: Family History  Problem Relation Age of Onset   Hyperlipidemia Mother    Arthritis Mother    Diabetes Mother    COPD Father    Diabetes Father    Heart disease Father    Hypertension Father    Arthritis Father    Stroke Father    Vision loss Father    Miscarriages / Stillbirths Sister    Diabetes Brother        type 1   Heart attack Maternal Grandmother 53       massive   Parkinson's disease Maternal Grandfather    Other Maternal Grandfather        passed away from heart complications   Pulmonary embolism Paternal Grandmother    Other Paternal Grandmother        brain aneurism   Throat cancer Paternal Grandfather     Physical Examination: Vitals:   08/14/24 1212  BP: 108/80    General: Patient is in no apparent distress. Attention to examination is appropriate.  Neck:   Supple.  Full range of motion.  Respiratory: Patient is breathing without any difficulty.   NEUROLOGICAL:    Strength: Side Biceps Triceps Deltoid Interossei Grip Wrist Ext. Wrist Flex.  R 5 5 5 5 5 5 5   L 5 5 5  4+ 5 5 5     Reflexes are 2+ and symmetric at the biceps, triceps, brachioradialis, patella and achilles.   Hoffman's is absent. Clonus is absent  Tinel at elbow, slight decrease in left ulnar distribution     No evidence of dysmetria noted.  Gait is normal.    Imaging: Narrative & Impression  EXAM: MRI CERVICAL SPINE WITHOUT CONTRAST 05/15/2024 01:15:09 PM   TECHNIQUE: Multiplanar multisequence MRI of the cervical spine was performed.   COMPARISON: None available.   CLINICAL HISTORY: Neck pain with arm pain and numbness.   FINDINGS:   BONES AND ALIGNMENT: Normal alignment. Normal vertebral body heights. Background Marrow Signal is unremarkable unless otherwise stated.   SPINAL CORD: Normal spinal cord size. No abnormal spinal cord signal.   SOFT TISSUES: No paraspinal mass.   C2-C3: No significant disc herniation. No spinal canal stenosis or neural  foraminal narrowing.   C3-C4: No significant disc herniation. No spinal canal stenosis or neural foraminal narrowing.   C4-C5: No impingement. Small central disc protrusion.   C5-C6: No impingement. Mild degenerative left facet arthropathy.   C6-C7: No impingement. Right paracentral disc protrusion.   C7-T1: No impingement. Mild disc bulge.   Note: A 0.6 cm probable atypical hemangioma is present in the T2 vertebral body on image 9, series 5.   IMPRESSION: 1. Small central disc protrusion at C4-5 without impingement. 2. Right paracentral disc protrusion at C6-7 without impingement. 3. Mild disc bulge at C7-T1 without impingement. 4. Mild degenerative findings of the left facet joint at C5-6.   Electronically signed by: Ryan Salvage MD 05/19/2024 04:36 PM EDT RP Workstation: [id number]    I have personally reviewed the images and agree with the above interpretation.  Assessment and Plan Assessment & Plan Ulnar neuropathy Chronic, mild ulnar neuropathy at the elbows with intermittent numbness, pain, and weakness in the ulnar distribution, mild intrinsic hand muscle atrophy, and decreased sensation. MRI excluded cervical nerve root compression, supporting a peripheral etiology. Symptoms remain mild and amenable to conservative management. Prognosis is  favorable with lifestyle modification; escalation to pharmacologic or procedural intervention if conservative measures fail. - Recommended lifestyle modifications to reduce elbow pressure, including optimizing desk ergonomics and using forearm support. - Advised use of heelbo pads during activities that provoke symptoms. - Recommended trial of nighttime elbow extension splints, alternating arms as needed. - Deferred nerve pain medications to avoid masking ongoing injury; to be considered if conservative measures fail. - Deferred EMG/nerve conduction studies given classic clinical findings and preference for initial  conservative management. - Arranged follow-up in 3 months for re-examination and reassessment, with option to cancel if symptoms resolve.  Cervical spondylosis with neck pain Chronic neck pain with reduced cervical range of motion and loss of cervical lordosis, consistent with mild cervical spondylosis and age-related degenerative changes. Imaging demonstrates mild disc bulges and early arthritis without significant foraminal or central stenosis, and no evidence of nerve root or spinal cord compression. Non-surgical management is appropriate; prognosis is benign and non-progressive. - Recommended physical therapy to improve neck strength and posture. - Advised lifestyle modifications, including supportive pillows and ergonomic adjustments at work and home. - Discussed over-the-counter cervical traction devices as a potential adjunct for symptom relief, to be used as tolerated. - Provided reassurance regarding the benign nature of imaging findings and the chronic, non-progressive nature of symptoms.   Penne MICAEL Sharps MD/MSCR Neurosurgery      [1]  Current Outpatient Medications:    acetaminophen  (TYLENOL ) 500 MG tablet, Take 1,000 mg by mouth every 6 (six) hours as needed., Disp: , Rfl:    Galcanezumab -gnlm (EMGALITY ) 120 MG/ML SOAJ, Inject 120 mg into the skin every 30 (thirty) days., Disp: 1 mL, Rfl: 1   ibuprofen  (ADVIL ) 800 MG tablet, Take 1 tablet (800 mg total) by mouth every 8 (eight) hours as needed for pain. Take up to 6 doses. Take 1 hour prior to procedure, Then take every 8 hours as needed with food, Disp: 6 tablet, Rfl: 0   levonorgestrel  (MIRENA ) 20 MCG/DAY IUD, 1 each by Intrauterine route once., Disp: , Rfl:    Rimegepant Sulfate  (NURTEC) 75 MG TBDP, Take 1 tablet (75 mg total) by mouth as directed., Disp: 16 tablet, Rfl: 11 [2]  Social History Tobacco Use   Smoking status: Never    Passive exposure: Never   Smokeless tobacco: Never  Vaping Use   Vaping status: Never  Used  Substance Use Topics   Alcohol use: No   Drug use: No   "

## 2024-08-04 ENCOUNTER — Ambulatory Visit: Admitting: Physician Assistant

## 2024-08-08 ENCOUNTER — Telehealth

## 2024-08-14 ENCOUNTER — Ambulatory Visit: Admitting: Neurosurgery

## 2024-08-14 ENCOUNTER — Encounter: Payer: Self-pay | Admitting: Neurosurgery

## 2024-08-14 VITALS — BP 108/80 | Ht 67.0 in | Wt 167.4 lb

## 2024-08-14 DIAGNOSIS — M5430 Sciatica, unspecified side: Secondary | ICD-10-CM

## 2024-08-14 DIAGNOSIS — G8929 Other chronic pain: Secondary | ICD-10-CM | POA: Diagnosis not present

## 2024-08-14 DIAGNOSIS — G5622 Lesion of ulnar nerve, left upper limb: Secondary | ICD-10-CM

## 2024-08-14 DIAGNOSIS — M542 Cervicalgia: Secondary | ICD-10-CM

## 2024-08-14 DIAGNOSIS — M47812 Spondylosis without myelopathy or radiculopathy, cervical region: Secondary | ICD-10-CM | POA: Diagnosis not present

## 2024-08-21 DIAGNOSIS — M542 Cervicalgia: Secondary | ICD-10-CM | POA: Insufficient documentation

## 2024-08-21 DIAGNOSIS — G5622 Lesion of ulnar nerve, left upper limb: Secondary | ICD-10-CM | POA: Insufficient documentation
